# Patient Record
Sex: Female | Born: 1960 | Race: Black or African American | Hispanic: No | Marital: Married | Smoking: Never smoker
Health system: Southern US, Community
[De-identification: ages and names within clinical notes are randomized; demographics above are authoritative.]

## PROBLEM LIST (undated history)

## (undated) DIAGNOSIS — C50919 Malignant neoplasm of unspecified site of unspecified female breast: Secondary | ICD-10-CM

## (undated) DIAGNOSIS — N6009 Solitary cyst of unspecified breast: Secondary | ICD-10-CM

## (undated) DIAGNOSIS — R51 Headache: Secondary | ICD-10-CM

## (undated) DIAGNOSIS — Z923 Personal history of irradiation: Secondary | ICD-10-CM

## (undated) DIAGNOSIS — T7840XA Allergy, unspecified, initial encounter: Secondary | ICD-10-CM

## (undated) HISTORY — DX: Headache: R51

## (undated) HISTORY — DX: Malignant neoplasm of unspecified site of unspecified female breast: C50.919

## (undated) HISTORY — DX: Allergy, unspecified, initial encounter: T78.40XA

## (undated) HISTORY — PX: KNEE SURGERY: SHX244

## (undated) HISTORY — DX: Personal history of irradiation: Z92.3

## (undated) HISTORY — PX: CARPAL TUNNEL RELEASE: SHX101

## (undated) HISTORY — DX: Solitary cyst of unspecified breast: N60.09

---

## 1991-08-26 HISTORY — PX: FOOT SURGERY: SHX648

## 2000-03-10 ENCOUNTER — Ambulatory Visit (HOSPITAL_COMMUNITY): Admission: RE | Admit: 2000-03-10 | Discharge: 2000-03-10 | Payer: Self-pay | Admitting: Family Medicine

## 2000-03-10 ENCOUNTER — Encounter: Payer: Self-pay | Admitting: Family Medicine

## 2002-01-10 ENCOUNTER — Encounter: Payer: Self-pay | Admitting: Family Medicine

## 2002-01-10 ENCOUNTER — Encounter: Admission: RE | Admit: 2002-01-10 | Discharge: 2002-01-10 | Payer: Self-pay | Admitting: Family Medicine

## 2002-02-01 ENCOUNTER — Encounter: Payer: Self-pay | Admitting: Family Medicine

## 2002-02-01 ENCOUNTER — Encounter: Admission: RE | Admit: 2002-02-01 | Discharge: 2002-02-01 | Payer: Self-pay | Admitting: Family Medicine

## 2003-11-03 ENCOUNTER — Encounter: Admission: RE | Admit: 2003-11-03 | Discharge: 2003-11-03 | Payer: Self-pay | Admitting: Family Medicine

## 2006-08-03 ENCOUNTER — Other Ambulatory Visit: Admission: RE | Admit: 2006-08-03 | Discharge: 2006-08-03 | Payer: Self-pay | Admitting: Family Medicine

## 2006-08-13 ENCOUNTER — Encounter: Admission: RE | Admit: 2006-08-13 | Discharge: 2006-08-13 | Payer: Self-pay | Admitting: Family Medicine

## 2008-11-21 ENCOUNTER — Emergency Department (HOSPITAL_COMMUNITY): Admission: EM | Admit: 2008-11-21 | Discharge: 2008-11-21 | Payer: Self-pay | Admitting: Emergency Medicine

## 2009-12-28 ENCOUNTER — Encounter: Admission: RE | Admit: 2009-12-28 | Discharge: 2009-12-28 | Payer: Self-pay | Admitting: Obstetrics and Gynecology

## 2010-12-05 LAB — POCT URINALYSIS DIP (DEVICE)
Glucose, UA: NEGATIVE mg/dL
Nitrite: NEGATIVE
Protein, ur: NEGATIVE mg/dL
Specific Gravity, Urine: 1.02 (ref 1.005–1.030)

## 2010-12-05 LAB — POCT PREGNANCY, URINE: Preg Test, Ur: NEGATIVE

## 2010-12-05 LAB — GC/CHLAMYDIA PROBE AMP, GENITAL: GC Probe Amp, Genital: NEGATIVE

## 2011-08-25 ENCOUNTER — Other Ambulatory Visit: Payer: Self-pay | Admitting: Obstetrics and Gynecology

## 2011-08-25 DIAGNOSIS — R928 Other abnormal and inconclusive findings on diagnostic imaging of breast: Secondary | ICD-10-CM

## 2011-09-09 ENCOUNTER — Ambulatory Visit
Admission: RE | Admit: 2011-09-09 | Discharge: 2011-09-09 | Disposition: A | Discharge status: Home / Self Care | Payer: BC Managed Care – PPO | Source: Ambulatory Visit | Attending: Obstetrics and Gynecology | Admitting: Obstetrics and Gynecology

## 2011-09-09 DIAGNOSIS — R928 Other abnormal and inconclusive findings on diagnostic imaging of breast: Secondary | ICD-10-CM

## 2012-03-25 ENCOUNTER — Other Ambulatory Visit: Payer: Self-pay | Admitting: Obstetrics and Gynecology

## 2012-03-25 DIAGNOSIS — R921 Mammographic calcification found on diagnostic imaging of breast: Secondary | ICD-10-CM

## 2012-08-25 DIAGNOSIS — Z923 Personal history of irradiation: Secondary | ICD-10-CM

## 2012-08-25 HISTORY — PX: BREAST LUMPECTOMY: SHX2

## 2012-08-25 HISTORY — DX: Personal history of irradiation: Z92.3

## 2012-08-25 HISTORY — PX: BREAST BIOPSY: SHX20

## 2012-12-14 ENCOUNTER — Other Ambulatory Visit: Payer: Self-pay | Admitting: Obstetrics and Gynecology

## 2012-12-14 ENCOUNTER — Ambulatory Visit
Admission: RE | Admit: 2012-12-14 | Discharge: 2012-12-14 | Disposition: A | Discharge status: Home / Self Care | Payer: BC Managed Care – PPO | Source: Ambulatory Visit | Attending: Obstetrics and Gynecology | Admitting: Obstetrics and Gynecology

## 2012-12-14 DIAGNOSIS — R921 Mammographic calcification found on diagnostic imaging of breast: Secondary | ICD-10-CM

## 2012-12-14 DIAGNOSIS — C50919 Malignant neoplasm of unspecified site of unspecified female breast: Secondary | ICD-10-CM

## 2012-12-14 HISTORY — DX: Malignant neoplasm of unspecified site of unspecified female breast: C50.919

## 2012-12-15 ENCOUNTER — Other Ambulatory Visit: Payer: Self-pay | Admitting: Obstetrics and Gynecology

## 2012-12-15 DIAGNOSIS — C50911 Malignant neoplasm of unspecified site of right female breast: Secondary | ICD-10-CM

## 2012-12-17 ENCOUNTER — Telehealth: Payer: Self-pay | Admitting: *Deleted

## 2012-12-17 DIAGNOSIS — C50111 Malignant neoplasm of central portion of right female breast: Secondary | ICD-10-CM

## 2012-12-17 DIAGNOSIS — C50119 Malignant neoplasm of central portion of unspecified female breast: Secondary | ICD-10-CM | POA: Insufficient documentation

## 2012-12-17 NOTE — Telephone Encounter (Signed)
Confirmed [facility] for 12/22/12 at 1200.  Instructions and contact information given.

## 2012-12-18 ENCOUNTER — Ambulatory Visit
Admission: RE | Admit: 2012-12-18 | Discharge: 2012-12-18 | Disposition: A | Discharge status: Home / Self Care | Payer: BC Managed Care – PPO | Source: Ambulatory Visit | Attending: Obstetrics and Gynecology | Admitting: Obstetrics and Gynecology

## 2012-12-18 DIAGNOSIS — N6009 Solitary cyst of unspecified breast: Secondary | ICD-10-CM

## 2012-12-18 DIAGNOSIS — C50911 Malignant neoplasm of unspecified site of right female breast: Secondary | ICD-10-CM

## 2012-12-18 HISTORY — DX: Solitary cyst of unspecified breast: N60.09

## 2012-12-18 MED ORDER — GADOBENATE DIMEGLUMINE 529 MG/ML IV SOLN
15.0000 mL | Freq: Once | INTRAVENOUS | Status: AC | PRN
  Administered 2012-12-18: 15 mL via INTRAVENOUS

## 2012-12-22 ENCOUNTER — Ambulatory Visit (HOSPITAL_BASED_OUTPATIENT_CLINIC_OR_DEPARTMENT_OTHER): Payer: BC Managed Care – PPO | Admitting: Oncology

## 2012-12-22 ENCOUNTER — Ambulatory Visit: Payer: BC Managed Care – PPO | Admitting: Physical Therapy

## 2012-12-22 ENCOUNTER — Encounter (INDEPENDENT_AMBULATORY_CARE_PROVIDER_SITE_OTHER): Payer: Self-pay | Admitting: General Surgery

## 2012-12-22 ENCOUNTER — Ambulatory Visit
Admission: RE | Admit: 2012-12-22 | Discharge: 2012-12-22 | Disposition: A | Discharge status: Home / Self Care | Payer: BC Managed Care – PPO | Source: Ambulatory Visit | Attending: Radiation Oncology | Admitting: Radiation Oncology

## 2012-12-22 ENCOUNTER — Ambulatory Visit (HOSPITAL_BASED_OUTPATIENT_CLINIC_OR_DEPARTMENT_OTHER): Payer: BC Managed Care – PPO | Admitting: General Surgery

## 2012-12-22 ENCOUNTER — Other Ambulatory Visit (HOSPITAL_BASED_OUTPATIENT_CLINIC_OR_DEPARTMENT_OTHER): Payer: BC Managed Care – PPO | Admitting: Lab

## 2012-12-22 ENCOUNTER — Encounter: Payer: Self-pay | Admitting: Specialist

## 2012-12-22 ENCOUNTER — Encounter: Payer: Self-pay | Admitting: *Deleted

## 2012-12-22 ENCOUNTER — Encounter: Payer: Self-pay | Admitting: Radiation Oncology

## 2012-12-22 ENCOUNTER — Ambulatory Visit: Payer: BC Managed Care – PPO

## 2012-12-22 VITALS — BP 119/67 | HR 82 | Temp 98.3°F | Resp 20 | Ht 62.0 in | Wt 165.5 lb

## 2012-12-22 DIAGNOSIS — C50111 Malignant neoplasm of central portion of right female breast: Secondary | ICD-10-CM

## 2012-12-22 DIAGNOSIS — C50119 Malignant neoplasm of central portion of unspecified female breast: Secondary | ICD-10-CM

## 2012-12-22 LAB — COMPREHENSIVE METABOLIC PANEL (CC13)
AST: 16 U/L (ref 5–34)
Calcium: 9.2 mg/dL (ref 8.4–10.4)
Glucose: 95 mg/dl (ref 70–99)
Potassium: 4.1 mEq/L (ref 3.5–5.1)
Sodium: 141 mEq/L (ref 136–145)
Total Bilirubin: 0.28 mg/dL (ref 0.20–1.20)

## 2012-12-22 LAB — CBC WITH DIFFERENTIAL/PLATELET
Basophils Absolute: 0 10*3/uL (ref 0.0–0.1)
EOS%: 1.3 % (ref 0.0–7.0)
Eosinophils Absolute: 0.1 10*3/uL (ref 0.0–0.5)
HCT: 37.8 % (ref 34.8–46.6)
HGB: 12.3 g/dL (ref 11.6–15.9)
LYMPH%: 44.2 % (ref 14.0–49.7)
MCH: 29.1 pg (ref 25.1–34.0)
MCV: 89.1 fL (ref 79.5–101.0)
MONO%: 7.3 % (ref 0.0–14.0)
NEUT#: 2.5 10*3/uL (ref 1.5–6.5)
RDW: 13.1 % (ref 11.2–14.5)
WBC: 5.4 10*3/uL (ref 3.9–10.3)

## 2012-12-22 NOTE — Progress Notes (Signed)
I met the patient and her husband and daughter today at breast clinic.  She rated her distress as "2".  I told Brittany Colon about the available support services and made a referral to [facility] on her behalf.

## 2012-12-22 NOTE — Progress Notes (Signed)
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 female who is seen out of the courtesy of Dr. Johna Sheriff as part of the multi disciplinary breast clinic.   Earlier this year on routine screening mammography the patient was noted to have  suspicious calcifications in the upper central aspect of the right breast.  this area was biopsied and revealed high-grade ductal carcinoma in situ grade.   the tumor was estrogen receptor positive at 100% and progesterone receptor positive at 100%. An MRI of the breast area was performed which revealed in the upper central aspect of the right breast a small signal void consistent with tissue marker placed at the time of stereotactic guided core biopsy. There was no other suspicious changes noted in the left or right breast or axillary region. Patient was noted to have multiple bilateral breast cysts on her MRI.   PREVIOUS RADIATION THERAPY: No  PAST MEDICAL HISTORY:  has a past medical history of Breast cancer and Headache.    PAST SURGICAL HISTORY: Past Surgical History  Procedure Laterality Date  . Carpal tunnel release Right     FAMILY HISTORY: family history is not on file.  SOCIAL HISTORY:  reports that she has never smoked. She does not have any smokeless tobacco history on file. She reports that she does not drink alcohol or use illicit drugs.  she works as a Architectural technologist  ALLERGIES: Sulfur  MEDICATIONS:  Current Outpatient Prescriptions  Medication Sig Dispense Refill  . glucosamine-chondroitin 500-400 MG tablet Take 1 tablet by mouth daily.      . naproxen sodium (ANAPROX) 220 MG  tablet Take 220 mg by mouth as needed.       No current facility-administered medications for this encounter.    REVIEW OF SYSTEMS:  A 15 point review of systems is documented in the electronic medical record. This was obtained by the nursing staff. However, I reviewed this with the patient to discuss relevant findings and make appropriate changes.  Prior to biopsy the patient denied any pain in the breast area nipple discharge or bleeding. She denies any new bony pain headaches dizziness or blurred vision.   PHYSICAL EXAM: In Gen. this is a very pleasant healthy-appearing 52 year old female in no acute distress. She is accompanied by her husband and daughter on evaluation today. Examination of the neck and supraclavicular region reveals no evidence of adenopathy. The axillary areas are free of adenopathy. Examination of the lungs reveals them to be clear. The heart has a regular rhythm and rate. Examination of the left breast reveals multiple cysts noted in the lateral aspect of the breast. There is no dominant mass appreciated in the left breast nipple discharge or bleeding. Examination of the right breast reveals some bruising in the upper central aspect. There multiple cysts palpated in the right breast. There is no dominant mass appreciated in the right breast nipple discharge or bleeding.   LABORATORY DATA:  Lab Results  Component Value Date   WBC 5.4 12/22/2012   HGB 12.3 12/22/2012   HCT 37.8 12/22/2012   MCV 89.1 12/22/2012   PLT 241 12/22/2012   Lab Results  Component Value Date  NA 141 12/22/2012   K 4.1 12/22/2012   CL 107 12/22/2012   CO2 28 12/22/2012   Lab Results  Component Value Date   ALT 11 12/22/2012   AST 16 12/22/2012   ALKPHOS 61 12/22/2012   BILITOT 0.28 12/22/2012     RADIOGRAPHY: Mr Breast Bilateral W Wo Contrast  12/20/2012  *RADIOLOGY REPORT*  Clinical Data: Recent stereotactic guided core biopsy shows ductal carcinoma in situ in the upper central portion of the  right breast.  BILATERAL BREAST MRI WITH AND WITHOUT CONTRAST  Technique: Multiplanar, multisequence MR images of both breasts were obtained prior to and following the intravenous administration of 15ml of Multihance.  Three dimensional images were evaluated at the independent DynaCad workstation.  Comparison:  [facility] 12/14/2012 and earlier  Findings: Within the upper central portion of the right breast, there is a small signal void from tissue marker placed at the time of stereotactic guided core biopsy.  Surrounding this, there is minimal hematoma/seroma.  Throughout both breasts, there are numerous cysts.  Some are high signal intensity on T2-weighted images.  Others are lower in signal.  No suspicious, enhancing masses are identified in either breast.  Several rim enhancing cyst are noted bilaterally, a finding that can be associated with inflammation or previous aspiration.  Specifically, in the area of recent biopsy, there is no significant enhancement.  Overall, background parenchymal enhancement is minimal  No suspicious adenopathy identified in the internal mammary or axillary regions.  IMPRESSION:  1.  Expected post-biopsy changes in the upper central portion of the right breast, without significant enhancement. 2.  Numerous bilateral breast cysts. 3.  No additional enhancement in either breast to suggest malignancy.  RECOMMENDATION: Treatment plan  THREE-DIMENSIONAL MR IMAGE RENDERING ON INDEPENDENT WORKSTATION:  Three-dimensional MR images were rendered by post-processing of the original MR data on an independent workstation.  The three- dimensional MR images were interpreted, and findings were reported in the accompanying complete MRI report for this study.  BI-RADS CATEGORY 6:  Known biopsy-proven malignancy - appropriate action should be taken.   Original Report Authenticated By: Norva Pavlov, M.D.    Mm Digital Diagnostic Bilat  12/14/2012   *RADIOLOGY REPORT*  Clinical Data:  The patient returns for short interval follow-up of calcifications in the upper portion of the right breast.  She did not return for recommended 39-month follow-up.  DIGITAL DIAGNOSTIC BILATERAL MAMMOGRAM WITH CAD  Comparison: 09/09/2011, 08/14/2011, 12/28/2009, 08/13/2006  Findings:  ACR Breast Density Category 3: The breast tissue is heterogeneously dense.  Calcifications at 12 o'clock 7 cm from the right nipple have increased since prior study.  These cover an area of approximately 6 x 7 x 7 mm.  They vary in size and shape and are tightly clustered.  The appearance is suspicious.  Multiple bilateral fluctuating partially obscured partially circumscribed masses consistent with cysts are again noted.  No other abnormality is identified.  Mammographic images were processed with CAD.  IMPRESSION: Suspicious calcifications in the 12 o'clock position of the right breast, 7 cm from the nipple.  RECOMMENDATION: Biopsy is recommended.  Stereotactic core needle biopsy was discussed with the patient.  This will be performed and reported separately.  I have discussed the findings and recommendations with the patient. Results were also provided in writing at the conclusion of the visit.  If applicable, a reminder letter will be sent to the patient regarding her next appointment.  BI-RADS CATEGORY 4:  Suspicious abnormality -  biopsy should be considered.   Original Report Authenticated By: Cain Saupe, M.D.    Mm Rt Breast Bx W Loc Dev 1st Lesion Image Bx Spec Stereo Guide  12/15/2012  **ADDENDUM** CREATED: 12/15/2012 13:08:01  The pathology revealed ductal carcinoma in situ.  This is found to be concordant with imaging findings.  I discussed the results over the phone with the patient and answer her questions.  The patient has appointment for MRI of the breast on December 18, 2012.  The patient also has appointment at multidisciplinary cancer clinic on December 22, 2012.  Recommend surgical  excision and MRI of the breasts.  **END ADDENDUM** SIGNED BY: Sherian Rein, M.D.   12/14/2012  *RADIOLOGY REPORT*  Clinical Data:  Microcalcifications at 12 o'clock, 7 cm from the right nipple  STEREOTACTIC-GUIDED VACUUM ASSISTED BIOPSY OF THE RIGHT BREAST AND SPECIMEN RADIOGRAPH  The patient and I discussed the procedure of stereotactic-guided biopsy, including benefits and alternatives.  We discussed the high likelihood of a successful procedure. We discussed the risks of the procedure, including infection, bleeding, tissue injury, clip migration, and inadequate sampling.  Written informed consent was given. Appropriate time-out was performed.  Using sterile technique, 2% lidocaine, stereotactic guidance, and a 9 gauge vacuum assisted device, biopsy was performed of the calcifications at 12 o'clock 7 cm from the right nipple using a craniocaudal approach.  Specimen radiograph was performed, showing microcalcifications within multiple core specimens.  Specimens with calcifications are identified for pathology.  At the conclusion of the procedure, a dumbbell shaped tissue marker clip was deployed into the biopsy cavity.  Follow-up 2-view mammogram confirmed clip placement and removal of many of the calcifications.  IMPRESSION: Stereotactic-guided biopsy of calcifications at 12 o'clock, 7 cm from the right nipple.  No apparent complications.   Original Report Authenticated By: Cain Saupe, M.D.       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  The patient would be an excellent candidate for breast conservation therapy with excisional biopsy followed by radiation therapy.  The patient does not wish to consider mastectomy for management in this situation.  PLAN: The patient will be scheduled for surgery with Dr. Johna Sheriff in the near future. Patient will be seen in the postoperative setting for evaluation and planning of her radiation therapy as part of breast conserving treatment.      ------------------------------------------------  -----------------------------------  Billie Lade, PhD, MD

## 2012-12-22 NOTE — Progress Notes (Signed)
Subjective:   new diagnosis DCIS right breast  Patient ID: Brittany Colon, female   DOB: [birth year], 52 y.o.   MRN: [id number]  HPI Patient is a very pleasant 52 year old female seen in the breast multidisciplinary clinic for a new diagnosis of ductal carcinoma in situ of the right breast. The patient recently presented for followup mammogram and was found to have a tightly clustered area of abnormal microcalcifications in the upper central right breast measuring 7 mm in diameter. Large core needle biopsy was recommended and performed. I've reviewed pathology which has shown high-grade ductal carcinoma in situ with necrosis. She subsequently has had bilateral breast MRI which has revealed a small infiltrate in the area of the biopsy but no abnormal enhancement. There are multiple cysts throughout both breasts. The patient had not noted any change in her breast exam prior to her diagnosis. She has chronically "lumpy" breasts. She has had no unusual pain or nipple discharge or bleeding or skin changes.  Past Medical History  Diagnosis Date  . Breast cancer   . Headache    Past Surgical History  Procedure Laterality Date  . Carpal tunnel release Right    Current Outpatient Prescriptions  Medication Sig Dispense Refill  . glucosamine-chondroitin 500-400 MG tablet Take 1 tablet by mouth daily.      . naproxen sodium (ANAPROX) 220 MG tablet Take 220 mg by mouth as needed.       No current facility-administered medications for this visit.   Allergies  Allergen Reactions  . Sulfur     vomiting   History  Substance Use Topics  . Smoking status: Never Smoker   . Smokeless tobacco: Not on file  . Alcohol Use: No  She works as a Chartered loss adjuster.   Review of Systems  Constitutional: Negative.   HENT: Negative.   Respiratory: Negative.   Cardiovascular: Negative.   Gastrointestinal: Negative.        Has never had colonoscopy.  Musculoskeletal: Negative.   Neurological: Negative.       Objective:   Physical Exam General: Well-developed healthy-appearing African American female in no distress Skin: No rash or infection HEENT: No palpable masses or thyromegaly. Pupils equal and reactive. Sclerae clear. Lymph nodes: No cervical, supraclavicular or axillary nodes palpable Breasts: Slight swelling in the upper central right breast at the core biopsy site. There is some irregular breast tissue bilaterally. No skin or nipple changes. Again no axillary nodes palpable Lungs: Clear equal breath sounds bilaterally without wheezing agree for breathing Cardiac: Regular rate and rhythm without murmur. No edema. Abdomen: Soft and nontender. No masses or organomegaly. Extremities: No joint swelling or edema or deformity Neurologic: Alert and fully oriented. Affect normal. Gait normal.    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 We discussed in detail initial surgical treatment options. She certainly would be a good candidate for breast conservation which is what she would prefer. We discussed mastectomy as an alternative which would not require radiation but she does prefer minimal surgery. We discussed needle localized lumpectomy as an outpatient. We discussed the rationale for the procedure and its nature in recovery as well as risks of anesthetic complications, bleeding, infection and possible need for further surgery based on final pathology.    Plan:     Needle localized right breast lumpectomy under general anesthesia an outpatient

## 2012-12-23 ENCOUNTER — Telehealth: Payer: Self-pay | Admitting: *Deleted

## 2012-12-23 NOTE — Telephone Encounter (Signed)
Lm stating that [facility] would like to see her on 03/14/13 @ 2:30pm. i also made pt aware that i will mail a letter/cal as well...td

## 2012-12-24 NOTE — Progress Notes (Signed)
ID: Brittany Colon   DOB: May 20, 1961  MR#: [id number]  NWG#:[id number]  PCP: Thayer Headings, MD GYN: Carrington Clamp SU: Glenna Fellows OTHER MD: Antony Blackbird   HISTORY OF PRESENT ILLNESS: "Brittany Colon" has lumpy breasts, and has had diagnostic mammography previously. Screening mammography 08/14/2011 suggested a possible change in the right breast, and diagnostic right breast mammography 09/09/2011 found a cluster of coarse calcifications superiorly in the right breast. These were felt to be likely benign, and six-month followup was recommended. However the next mammogram was 12/14/2012. This showed the calcifications in question to have increased since the prior study. The covered an area of 7 mm. 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  Bilateral breast MRI 12/18/2012 showed only post biopsy change and multiple breast cysts bilaterally. The patient's subsequent history is as detailed below  INTERVAL HISTORY: Brittany Colon was seen at the multidisciplinary breast cancer clinic 12/22/2012 accompanied by her husband Brittany Colon and her daughter Brittany Colon  REVIEW OF SYSTEMS: The patient had no symptoms leading to this mammogram, which was actually delayed, as she had not shown for the 6 month followup. She describes herself as mildly fatigued. Sometimes she feels palpitations. These are not accompanied by chest pain or pressure or shortness of breath. She can however be short of breath if she climbs to sleep flight of steps. She has joint pains here in there which are not more frequent or intense than prior. She has rare headaches. A detailed review of systems was otherwise noncontributory   PAST MEDICAL HISTORY: Past Medical History  Diagnosis Date  . Breast cancer   . Headache     PAST SURGICAL HISTORY: Past Surgical History  Procedure Laterality Date  . Carpal tunnel release Right     FAMILY  HISTORY No family history on file. The patient's father died from Parkinson's disease at age 55. The patient's mother died from a myocardial infarction at age 68. Lives has 3 brothers, one sister. There is no history of cancer in the family to her knowledge.   GYNECOLOGIC HISTORY: Menarche age 32, first live birth age 74. She is GX P2. She still having regular periods. She never took birth control pills.   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 Her husband Brittany Colon is disabled secondary to sleep apnea and fibromyalgia. Daughter Brittany Colon works as a Engineer, site at [employer] son Brittany Colon is Clinical biochemist at [employer]. The patient has 1 grandchild. She attends a [employer] in [place]   ADVANCED DIRECTIVES:Not in place   HEALTH MAINTENANCE: History  Substance Use Topics  . Smoking status: Never Smoker   . Smokeless tobacco: Not on file  . Alcohol Use: No     Colonoscopy:  PAP:  Bone density:  Lipid panel:  Allergies  Allergen Reactions  . Sulfur     vomiting    Current Outpatient Prescriptions  Medication Sig Dispense Refill  . glucosamine-chondroitin 500-400 MG tablet Take 1 tablet by mouth daily.      . naproxen sodium (ANAPROX) 220 MG tablet Take 220 mg by mouth as needed.       No current facility-administered medications for this visit.    OBJECTIVE: Middle-aged [place] American woman who appears well  Filed Vitals:   12/22/12 1245  BP: 119/67  Pulse: 82  Temp: 98.3 F (36.8 C)  Resp: 20     Body mass index  is 30.26 kg/(m^2).    ECOG FS: 0  Sclerae unicteric Oropharynx clear No cervical or supraclavicular adenopathy Lungs no rales or rhonchi Heart regular rate and rhythm Abd benign MSK no focal spinal tenderness, no peripheral edema Neuro: nonfocal, well oriented, appropriate affect  Breasts: The right breast is status post recent biopsy. Both breasts are 1P. There is no  skin or nipple involvement on the right and the right axilla is benign   LAB RESULTS: Lab Results  Component Value Date   WBC 5.4 12/22/2012   NEUTROABS 2.5 12/22/2012   HGB 12.3 12/22/2012   HCT 37.8 12/22/2012   MCV 89.1 12/22/2012   PLT 241 12/22/2012      Chemistry      Component Value Date/Time   NA 141 12/22/2012 1219   K 4.1 12/22/2012 1219   CL 107 12/22/2012 1219   CO2 28 12/22/2012 1219   BUN 14.3 12/22/2012 1219   CREATININE 0.8 12/22/2012 1219      Component Value Date/Time   CALCIUM 9.2 12/22/2012 1219   ALKPHOS 61 12/22/2012 1219   AST 16 12/22/2012 1219   ALT 11 12/22/2012 1219   BILITOT 0.28 12/22/2012 1219       No results found for this basename: LABCA2    No components found with this basename: LABCA125    No results found for this basename: INR,  in the last 168 hours  Urinalysis    Component Value Date/Time   LABSPEC 1.020 11/21/2008 1036   PHURINE 6.5 11/21/2008 1036   GLUCOSEU NEGATIVE 11/21/2008 1036   HGBUR NEGATIVE 11/21/2008 1036   BILIRUBINUR NEGATIVE 11/21/2008 1036   KETONESUR NEGATIVE 11/21/2008 1036   PROTEINUR NEGATIVE 11/21/2008 1036   UROBILINOGEN 0.2 11/21/2008 1036   NITRITE NEGATIVE 11/21/2008 1036   LEUKOCYTESUR NEGATIVE Biochemical Testing Only. Please order routine urinalysis from main lab if confirmatory testing is needed. 11/21/2008 1036    STUDIES: Mr Breast Bilateral W Wo Contrast  12/20/2012  *RADIOLOGY REPORT*  Clinical Data: Recent stereotactic guided core biopsy shows ductal carcinoma in situ in the upper central portion of the right breast.  BILATERAL BREAST MRI WITH AND WITHOUT CONTRAST  Technique: Multiplanar, multisequence MR images of both breasts were obtained prior to and following the intravenous administration of 15ml of Multihance.  Three dimensional images were evaluated at the independent DynaCad workstation.  Comparison:  [facility] 12/14/2012 and earlier  Findings: Within the  upper central portion of the right breast, there is a small signal void from tissue marker placed at the time of stereotactic guided core biopsy.  Surrounding this, there is minimal hematoma/seroma.  Throughout both breasts, there are numerous cysts.  Some are high signal intensity on T2-weighted images.  Others are lower in signal.  No suspicious, enhancing masses are identified in either breast.  Several rim enhancing cyst are noted bilaterally, a finding that can be associated with inflammation or previous aspiration.  Specifically, in the area of recent biopsy, there is no significant enhancement.  Overall, background parenchymal enhancement is minimal  No suspicious adenopathy identified in the internal mammary or axillary regions.  IMPRESSION:  1.  Expected post-biopsy changes in the upper central portion of the right breast, without significant enhancement. 2.  Numerous bilateral breast cysts. 3.  No additional enhancement in either breast to suggest malignancy.  RECOMMENDATION: Treatment plan  THREE-DIMENSIONAL MR IMAGE RENDERING ON INDEPENDENT WORKSTATION:  Three-dimensional MR images were rendered by post-processing of the original MR  data on an independent workstation.  The three- dimensional MR images were interpreted, and findings were reported in the accompanying complete MRI report for this study.  BI-RADS CATEGORY 6:  Known biopsy-proven malignancy - appropriate action should be taken.   Original Report Authenticated By: Norva Pavlov, M.D.    Mm Digital Diagnostic Bilat  12/14/2012  *RADIOLOGY REPORT*  Clinical Data:  The patient returns for short interval follow-up of calcifications in the upper portion of the right breast.  She did not return for recommended 25-month follow-up.  DIGITAL DIAGNOSTIC BILATERAL MAMMOGRAM WITH CAD  Comparison: 09/09/2011, 08/14/2011, 12/28/2009, 08/13/2006  Findings:  ACR Breast Density Category 3: The breast tissue is heterogeneously dense.  Calcifications at 12  o'clock 7 cm from the right nipple have increased since prior study.  These cover an area of approximately 6 x 7 x 7 mm.  They vary in size and shape and are tightly clustered.  The appearance is suspicious.  Multiple bilateral fluctuating partially obscured partially circumscribed masses consistent with cysts are again noted.  No other abnormality is identified.  Mammographic images were processed with CAD.  IMPRESSION: Suspicious calcifications in the 12 o'clock position of the right breast, 7 cm from the nipple.  RECOMMENDATION: Biopsy is recommended.  Stereotactic core needle biopsy was discussed with the patient.  This will be performed and reported separately.  I have discussed the findings and recommendations with the patient. Results were also provided in writing at the conclusion of the visit.  If applicable, a reminder letter will be sent to the patient regarding her next appointment.  BI-RADS CATEGORY 4:  Suspicious abnormality - biopsy should be considered.   Original Report Authenticated By: Cain Saupe, M.D.    Mm Rt Breast Bx W Loc Dev 1st Lesion Image Bx Spec Stereo Guide  12/15/2012  **ADDENDUM** CREATED: 12/15/2012 13:08:01  The pathology revealed ductal carcinoma in situ.  This is found to be concordant with imaging findings.  I discussed the results over the phone with the patient and answer her questions.  The patient has appointment for MRI of the breast on December 18, 2012.  The patient also has appointment at multidisciplinary cancer clinic on December 22, 2012.  Recommend surgical excision and MRI of the breasts.  **END ADDENDUM** SIGNED BY: Sherian Rein, M.D.   12/14/2012  *RADIOLOGY REPORT*  Clinical Data:  Microcalcifications at 12 o'clock, 7 cm from the right nipple  STEREOTACTIC-GUIDED VACUUM ASSISTED BIOPSY OF THE RIGHT BREAST AND SPECIMEN RADIOGRAPH  The patient and I discussed the procedure of stereotactic-guided biopsy, including benefits and alternatives.  We discussed the high  likelihood of a successful procedure. We discussed the risks of the procedure, including infection, bleeding, tissue injury, clip migration, and inadequate sampling.  Written informed consent was given. Appropriate time-out was performed.  Using sterile technique, 2% lidocaine, stereotactic guidance, and a 9 gauge vacuum assisted device, biopsy was performed of the calcifications at 12 o'clock 7 cm from the right nipple using a craniocaudal approach.  Specimen radiograph was performed, showing microcalcifications within multiple core specimens.  Specimens with calcifications are identified for pathology.  At the conclusion of the procedure, a dumbbell shaped tissue marker clip was deployed into the biopsy cavity.  Follow-up 2-view mammogram confirmed clip placement and removal of many of the calcifications.  IMPRESSION: Stereotactic-guided biopsy of calcifications at 12 o'clock, 7 cm from the right nipple.  No apparent complications.   Original Report Authenticated By: Cain Saupe, M.D.     ASSESSMENT:  52 y.o. [place] woman status post right breast biopsy 12/14/2012 for a high-grade ductal carcinoma in situ, 100% estrogen receptor positive, 100% progesterone receptor positive.  PLAN: We spent the better part of today's hour-long visit discussing the biology of breast cancer in general, and Liz's situation in particular. She understands her cancer is not life-threatening, as it is noninvasive. It does need to be dealt with, and she is a very good candidate for breast conservation. We're not planning to do a sentinel lymph node. She can consider our B-43 study and this can be operationalized once she sees radiation oncology after her surgery.  I am going to see her again in mid July. At that time she will have completed her local treatment and will be ready to consider systemic adjuvant antiestrogen therapy.  Yannis Broce C    12/24/2012

## 2012-12-28 ENCOUNTER — Telehealth: Payer: Self-pay | Admitting: *Deleted

## 2012-12-28 NOTE — Telephone Encounter (Signed)
This RN received call from pt inquiring about follow up post surgery- " my surgery is scheduled for 5/19 and my appointment with Dr Darnelle Catalan isn't until July

## 2013-01-04 ENCOUNTER — Encounter (HOSPITAL_BASED_OUTPATIENT_CLINIC_OR_DEPARTMENT_OTHER): Payer: Self-pay | Admitting: *Deleted

## 2013-01-04 NOTE — Progress Notes (Signed)
Pt had some palpitations about 15-20 yr ago-worh halter monitor-nothing was found-no meds and no problems since- No labs needed

## 2013-01-10 ENCOUNTER — Ambulatory Visit (HOSPITAL_BASED_OUTPATIENT_CLINIC_OR_DEPARTMENT_OTHER): Payer: BC Managed Care – PPO | Admitting: Anesthesiology

## 2013-01-10 ENCOUNTER — Ambulatory Visit
Admission: RE | Admit: 2013-01-10 | Discharge: 2013-01-10 | Disposition: A | Discharge status: Home / Self Care | Payer: BC Managed Care – PPO | Source: Ambulatory Visit | Attending: General Surgery | Admitting: General Surgery

## 2013-01-10 ENCOUNTER — Encounter (HOSPITAL_BASED_OUTPATIENT_CLINIC_OR_DEPARTMENT_OTHER)
Admission: RE | Disposition: A | Discharge status: Home / Self Care | Payer: Self-pay | Source: Ambulatory Visit | Attending: General Surgery

## 2013-01-10 ENCOUNTER — Encounter (HOSPITAL_BASED_OUTPATIENT_CLINIC_OR_DEPARTMENT_OTHER): Payer: Self-pay | Admitting: Anesthesiology

## 2013-01-10 ENCOUNTER — Ambulatory Visit (HOSPITAL_BASED_OUTPATIENT_CLINIC_OR_DEPARTMENT_OTHER)
Admission: RE | Admit: 2013-01-10 | Discharge: 2013-01-10 | Disposition: A | Discharge status: Home / Self Care | Payer: BC Managed Care – PPO | Source: Ambulatory Visit | Attending: General Surgery | Admitting: General Surgery

## 2013-01-10 DIAGNOSIS — C50111 Malignant neoplasm of central portion of right female breast: Secondary | ICD-10-CM

## 2013-01-10 DIAGNOSIS — D0590 Unspecified type of carcinoma in situ of unspecified breast: Secondary | ICD-10-CM | POA: Insufficient documentation

## 2013-01-10 HISTORY — PX: BREAST LUMPECTOMY WITH NEEDLE LOCALIZATION: SHX5759

## 2013-01-10 SURGERY — BREAST LUMPECTOMY WITH NEEDLE LOCALIZATION
Anesthesia: General | Site: Breast | Laterality: Right | Wound class: Clean

## 2013-01-10 MED ORDER — FENTANYL CITRATE 0.05 MG/ML IJ SOLN: 25.0000 ug | INTRAMUSCULAR | Status: DC | PRN

## 2013-01-10 MED ORDER — OXYCODONE HCL 5 MG PO TABS
5.0000 mg | ORAL_TABLET | Freq: Once | ORAL | Status: AC | PRN
  Administered 2013-01-10: 5 mg via ORAL

## 2013-01-10 MED ORDER — OXYCODONE HCL 5 MG/5ML PO SOLN: 5.0000 mg | Freq: Once | ORAL | Status: AC | PRN

## 2013-01-10 MED ORDER — LACTATED RINGERS IV SOLN
INTRAVENOUS | Status: DC
  Administered 2013-01-10: 13:00:00 via INTRAVENOUS

## 2013-01-10 MED ORDER — BUPIVACAINE-EPINEPHRINE 0.25% -1:200000 IJ SOLN
INTRAMUSCULAR | Status: DC | PRN
  Administered 2013-01-10: 10 mL

## 2013-01-10 MED ORDER — HYDROCODONE-ACETAMINOPHEN 5-325 MG PO TABS: 1.0000 | ORAL_TABLET | ORAL | Status: DC | PRN

## 2013-01-10 MED ORDER — PROPOFOL 10 MG/ML IV BOLUS
INTRAVENOUS | Status: DC | PRN
  Administered 2013-01-10: 180 mg via INTRAVENOUS

## 2013-01-10 MED ORDER — FENTANYL CITRATE 0.05 MG/ML IJ SOLN
INTRAMUSCULAR | Status: DC | PRN
  Administered 2013-01-10: 100 ug via INTRAVENOUS

## 2013-01-10 MED ORDER — DEXAMETHASONE SODIUM PHOSPHATE 4 MG/ML IJ SOLN
INTRAMUSCULAR | Status: DC | PRN
  Administered 2013-01-10: 10 mg via INTRAVENOUS

## 2013-01-10 MED ORDER — CEFAZOLIN SODIUM-DEXTROSE 2-3 GM-% IV SOLR
2.0000 g | INTRAVENOUS | Status: AC
  Administered 2013-01-10: 2 g via INTRAVENOUS

## 2013-01-10 MED ORDER — MIDAZOLAM HCL 5 MG/5ML IJ SOLN
INTRAMUSCULAR | Status: DC | PRN
  Administered 2013-01-10: 2 mg via INTRAVENOUS

## 2013-01-10 MED ORDER — CHLORHEXIDINE GLUCONATE 4 % EX LIQD: 1.0000 "application " | Freq: Once | CUTANEOUS | Status: DC

## 2013-01-10 MED ORDER — ONDANSETRON HCL 4 MG/2ML IJ SOLN: 4.0000 mg | Freq: Four times a day (QID) | INTRAMUSCULAR | Status: DC | PRN

## 2013-01-10 MED ORDER — LIDOCAINE HCL (CARDIAC) 20 MG/ML IV SOLN
INTRAVENOUS | Status: DC | PRN
  Administered 2013-01-10: 60 mg via INTRAVENOUS

## 2013-01-10 SURGICAL SUPPLY — 46 items
BINDER BREAST LRG (GAUZE/BANDAGES/DRESSINGS) ×2 IMPLANT
BLADE SURG 10 STRL SS (BLADE) IMPLANT
BLADE SURG 15 STRL LF DISP TIS (BLADE) ×1 IMPLANT
BLADE SURG 15 STRL SS (BLADE) ×1
CANISTER SUCTION 1200CC (MISCELLANEOUS) IMPLANT
CHLORAPREP W/TINT 26ML (MISCELLANEOUS) ×2 IMPLANT
CLIP TI MEDIUM 6 (CLIP) IMPLANT
CLIP TI WIDE RED SMALL 6 (CLIP) ×2 IMPLANT
CLOTH BEACON ORANGE TIMEOUT ST (SAFETY) ×2 IMPLANT
COVER MAYO STAND STRL (DRAPES) ×2 IMPLANT
COVER TABLE BACK 60X90 (DRAPES) ×2 IMPLANT
DERMABOND ADVANCED (GAUZE/BANDAGES/DRESSINGS) ×1
DERMABOND ADVANCED .7 DNX12 (GAUZE/BANDAGES/DRESSINGS) ×1 IMPLANT
DEVICE DUBIN W/COMP PLATE 8390 (MISCELLANEOUS) ×2 IMPLANT
DRAPE PED LAPAROTOMY (DRAPES) ×2 IMPLANT
DRAPE UTILITY XL STRL (DRAPES) ×2 IMPLANT
ELECT COATED BLADE 2.86 ST (ELECTRODE) ×2 IMPLANT
ELECT REM PT RETURN 9FT ADLT (ELECTROSURGICAL) ×2
ELECTRODE REM PT RTRN 9FT ADLT (ELECTROSURGICAL) ×1 IMPLANT
GLOVE BIOGEL PI IND STRL 8 (GLOVE) ×1 IMPLANT
GLOVE BIOGEL PI INDICATOR 8 (GLOVE) ×1
GLOVE INDICATOR 7.0 STRL GRN (GLOVE) ×2 IMPLANT
GLOVE SS BIOGEL STRL SZ 7.5 (GLOVE) ×1 IMPLANT
GLOVE SUPERSENSE BIOGEL SZ 7.5 (GLOVE) ×1
GOWN PREVENTION PLUS XLARGE (GOWN DISPOSABLE) ×2 IMPLANT
GOWN PREVENTION PLUS XXLARGE (GOWN DISPOSABLE) ×2 IMPLANT
KIT MARKER MARGIN INK (KITS) ×2 IMPLANT
NEEDLE HYPO 25X1 1.5 SAFETY (NEEDLE) ×2 IMPLANT
NS IRRIG 1000ML POUR BTL (IV SOLUTION) ×2 IMPLANT
PACK BASIN DAY SURGERY FS (CUSTOM PROCEDURE TRAY) ×2 IMPLANT
PENCIL BUTTON HOLSTER BLD 10FT (ELECTRODE) ×2 IMPLANT
SLEEVE SCD COMPRESS KNEE MED (MISCELLANEOUS) ×2 IMPLANT
STAPLER VISISTAT 35W (STAPLE) IMPLANT
SUT MON AB 3-0 SH 27 (SUTURE)
SUT MON AB 3-0 SH27 (SUTURE) IMPLANT
SUT MON AB 5-0 PS2 18 (SUTURE) ×2 IMPLANT
SUT SILK 3 0 SH 30 (SUTURE) IMPLANT
SUT VIC AB 3-0 SH 27 (SUTURE) ×1
SUT VIC AB 3-0 SH 27X BRD (SUTURE) ×1 IMPLANT
SUT VIC AB 4-0 BRD 54 (SUTURE) IMPLANT
SYR BULB 3OZ (MISCELLANEOUS) IMPLANT
SYR CONTROL 10ML LL (SYRINGE) ×2 IMPLANT
TOWEL OR 17X24 6PK STRL BLUE (TOWEL DISPOSABLE) ×2 IMPLANT
TOWEL OR NON WOVEN STRL DISP B (DISPOSABLE) IMPLANT
TUBE CONNECTING 20X1/4 (TUBING) IMPLANT
YANKAUER SUCT BULB TIP NO VENT (SUCTIONS) IMPLANT

## 2013-01-10 NOTE — Interval H&P Note (Signed)
History and Physical Interval Note:  01/10/2013 1:33 PM  Brittany Colon  has presented today for surgery, with the diagnosis of DCIS right breast  The various methods of treatment have been discussed with the patient and family. After consideration of risks, benefits and other options for treatment, the patient has consented to  Procedure(s): BREAST LUMPECTOMY WITH NEEDLE LOCALIZATION (Right) as a surgical intervention .  The patient's history has been reviewed, patient examined, no change in status, stable for surgery.  I have reviewed the patient's chart and labs.  Questions were answered to the patient's satisfaction.     [name] T

## 2013-01-10 NOTE — Anesthesia Preprocedure Evaluation (Signed)
Anesthesia Evaluation  Patient identified by MRN, date of birth, ID band Patient awake    Reviewed: Allergy & Precautions, H&P , NPO status , Patient's Chart, lab work & pertinent test results  Airway Mallampati: II  Neck ROM: full    Dental   Pulmonary          Cardiovascular     Neuro/Psych  Headaches,    GI/Hepatic   Endo/Other    Renal/GU      Musculoskeletal   Abdominal   Peds  Hematology   Anesthesia Other Findings   Reproductive/Obstetrics                           Anesthesia Physical Anesthesia Plan  ASA: II  Anesthesia Plan: General   Post-op Pain Management:    Induction: Intravenous  Airway Management Planned: LMA  Additional Equipment:   Intra-op Plan:   Post-operative Plan:   Informed Consent: I have reviewed the patients History and Physical, chart, labs and discussed the procedure including the risks, benefits and alternatives for the proposed anesthesia with the patient or authorized representative who has indicated his/her understanding and acceptance.     Plan Discussed with: CRNA, Anesthesiologist and Surgeon  Anesthesia Plan Comments:         Anesthesia Quick Evaluation  

## 2013-01-10 NOTE — Anesthesia Procedure Notes (Signed)
Procedure Name: LMA Insertion Performed by: Alvis Edgell W Pre-anesthesia Checklist: Patient identified, Timeout performed, Emergency Drugs available, Suction available and Patient being monitored Patient Re-evaluated:Patient Re-evaluated prior to inductionOxygen Delivery Method: Circle system utilized Preoxygenation: Pre-oxygenation with 100% oxygen Intubation Type: IV induction Ventilation: Mask ventilation without difficulty LMA: LMA inserted LMA Size: 4.0 Number of attempts: 1 Placement Confirmation: breath sounds checked- equal and bilateral and positive ETCO2 Tube secured with: Tape Dental Injury: Teeth and Oropharynx as per pre-operative assessment      

## 2013-01-10 NOTE — Anesthesia Postprocedure Evaluation (Signed)
Anesthesia Post Note  Patient: Brittany Colon  Procedure(s) Performed: Procedure(s) (LRB): BREAST LUMPECTOMY WITH NEEDLE LOCALIZATION (Right)  Anesthesia type: General  Patient location: PACU  Post pain: Pain level controlled and Adequate analgesia  Post assessment: Post-op Vital signs reviewed, Patient's Cardiovascular Status Stable, Respiratory Function Stable, Patent Airway and Pain level controlled  Last Vitals:  Filed Vitals:   01/10/13 1500  BP: 118/64  Pulse: 75  Temp:   Resp: 15    Post vital signs: Reviewed and stable  Level of consciousness: awake, alert  and oriented  Complications: No apparent anesthesia complications

## 2013-01-10 NOTE — Transfer of Care (Signed)
Immediate Anesthesia Transfer of Care Note  Patient: Brittany Colon  Procedure(s) Performed: Procedure(s): BREAST LUMPECTOMY WITH NEEDLE LOCALIZATION (Right)  Patient Location: PACU  Anesthesia Type:General  Level of Consciousness: sedated and patient cooperative  Airway & Oxygen Therapy: Patient Spontanous Breathing and Patient connected to face mask oxygen  Post-op Assessment: Report given to PACU RN and Post -op Vital signs reviewed and stable  Post vital signs: Reviewed and stable  Complications: No apparent anesthesia complications

## 2013-01-10 NOTE — H&P (View-Only) (Signed)
Subjective:   new diagnosis DCIS right breast  Patient ID: Brittany Colon, female   DOB: 01/25/1961, 52 y.o.   MRN: [id number]  HPI Patient is a very pleasant 52-year-old female seen in the breast multidisciplinary clinic for a new diagnosis of ductal carcinoma in situ of the right breast. The patient recently presented for followup mammogram and was found to have a tightly clustered area of abnormal microcalcifications in the upper central right breast measuring 7 mm in diameter. Large core needle biopsy was recommended and performed. I've reviewed pathology which has shown high-grade ductal carcinoma in situ with necrosis. She subsequently has had bilateral breast MRI which has revealed a small infiltrate in the area of the biopsy but no abnormal enhancement. There are multiple cysts throughout both breasts. The patient had not noted any change in her breast exam prior to her diagnosis. She has chronically "lumpy" breasts. She has had no unusual pain or nipple discharge or bleeding or skin changes.  Past Medical History  Diagnosis Date  . Breast cancer   . Headache    Past Surgical History  Procedure Laterality Date  . Carpal tunnel release Right    Current Outpatient Prescriptions  Medication Sig Dispense Refill  . glucosamine-chondroitin 500-400 MG tablet Take 1 tablet by mouth daily.      . naproxen sodium (ANAPROX) 220 MG tablet Take 220 mg by mouth as needed.       No current facility-administered medications for this visit.   Allergies  Allergen Reactions  . Sulfur     vomiting   History  Substance Use Topics  . Smoking status: Never Smoker   . Smokeless tobacco: Not on file  . Alcohol Use: No  She works as a schoolteacher.   Review of Systems  Constitutional: Negative.   HENT: Negative.   Respiratory: Negative.   Cardiovascular: Negative.   Gastrointestinal: Negative.        Has never had colonoscopy.  Musculoskeletal: Negative.   Neurological: Negative.       Objective:   Physical Exam General: Well-developed healthy-appearing African American female in no distress Skin: No rash or infection HEENT: No palpable masses or thyromegaly. Pupils equal and reactive. Sclerae clear. Lymph nodes: No cervical, supraclavicular or axillary nodes palpable Breasts: Slight swelling in the upper central right breast at the core biopsy site. There is some irregular breast tissue bilaterally. No skin or nipple changes. Again no axillary nodes palpable Lungs: Clear equal breath sounds bilaterally without wheezing agree for breathing Cardiac: Regular rate and rhythm without murmur. No edema. Abdomen: Soft and nontender. No masses or organomegaly. Extremities: No joint swelling or edema or deformity Neurologic: Alert and fully oriented. Affect normal. Gait normal.    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 We discussed in detail initial surgical treatment options. She certainly would be a good candidate for breast conservation which is what she would prefer. We discussed mastectomy as an alternative which would not require radiation but she does prefer minimal surgery. We discussed needle localized lumpectomy as an outpatient. We discussed the rationale for the procedure and its nature in recovery as well as risks of anesthetic complications, bleeding, infection and possible need for further surgery based on final pathology.    Plan:     Needle localized right breast lumpectomy under general anesthesia an outpatient      

## 2013-01-10 NOTE — Op Note (Signed)
Preoperative Diagnosis: DCIS right breast  Postoprative Diagnosis: DCIS right breast  Procedure: Procedure(s): BREAST LUMPECTOMY WITH NEEDLE LOCALIZATION   Surgeon: Glenna Fellows T   Assistants: *None  Anesthesia:  General LMA anesthesia  Indications:   Patient is a 52 year old female who on recent screening mammogram was found to have a new 7 mm cluster of microcalcifications in the upper outer right breast. Port core needle biopsy revealed high-grade ductal carcinoma in situ. Subsequent MRI has shown only a biopsy cavity. After extensive discussion of treatment options and risks which are detailed elsewhere we elect to proceed with needle localized right breast lumpectomy.  Procedure Detail:  Following successful needle localization, the patient is brought to the operating room, placed in the supine position on the operating table and laryngeal mask general anesthesia induced. The right breast was widely sterilely prepped and draped. She received preoperative IV antibiotics. Patient Timeout was performed incorrect procedure verified. A curvilinear incision was made just inferior to the wire insertion site and dissected carried into the subcutaneous tissue. The lesion appeared to be superficial and short the skin and subcutaneous flaps were raised and the wire brought into the incision. Using cautery I then excised a reasonably generous specimen of breast tissue around the shaft and the tip of the wire in an effort to obtain a negative margin. The breast tissue was extremely dense. The specimen was removed and oriented with a consent for permanent pathology. Prior to sending a specimen mammography was obtained which showed the clip and the wire apparently in the center of the specimen. The soft tissues infiltrated with Marcaine. Hemostasis was assured. The breast and subcutaneous tissue were closed in layers with interrupted 3-0 Vicryl after marking the cavity with clips. The skin was closed  with subcuticular 5-0 Monocryl and Dermabond. Sponge needle and attention counts were correct.   Estimated Blood Loss:  Minimal         Drains: none  Blood Given: none          Specimens: right breast tissue        Complications:  * No complications entered in OR log *         Disposition: PACU - hemodynamically stable.         Condition: stable

## 2013-01-11 ENCOUNTER — Encounter (HOSPITAL_BASED_OUTPATIENT_CLINIC_OR_DEPARTMENT_OTHER): Payer: Self-pay | Admitting: General Surgery

## 2013-01-13 ENCOUNTER — Telehealth (INDEPENDENT_AMBULATORY_CARE_PROVIDER_SITE_OTHER): Payer: Self-pay | Admitting: General Surgery

## 2013-01-13 NOTE — Telephone Encounter (Signed)
Call the patient with pathology report, all margins negative

## 2013-01-24 ENCOUNTER — Encounter: Payer: Self-pay | Admitting: *Deleted

## 2013-01-24 DIAGNOSIS — C50919 Malignant neoplasm of unspecified site of unspecified female breast: Secondary | ICD-10-CM | POA: Insufficient documentation

## 2013-01-24 NOTE — Progress Notes (Signed)
Location of Breast Cancer: right, 12 o'clock  Histology per Pathology Report: high grade DCIS w/microcalcifications, necrosis  Receptor Status: ER(+), PR (+), Her2-neu ()  Did patient present with symptoms (if so, please note symptoms) or was this found on screening mammography?: ROUTINE MAMMOGRAM SCREEN  Past/Anticipated interventions by surgeon, if any: 01/10/13 RIGHT LUMPECTOMY  Past/Anticipated interventions by medical oncology, if any: consider systemic adjuvant antiestrogen therapy s/p radiation, Dr Darnelle Catalan  Lymphedema issues, if any:  none  Pain issues, if any:    SAFETY ISSUES:  Prior radiation? no  Pacemaker/ICD? no  Possible current pregnancy?   Is the patient on methotrexate? 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

## 2013-01-26 ENCOUNTER — Encounter: Payer: Self-pay | Admitting: *Deleted

## 2013-01-26 ENCOUNTER — Ambulatory Visit
Admission: RE | Admit: 2013-01-26 | Discharge: 2013-01-26 | Disposition: A | Discharge status: Home / Self Care | Payer: BC Managed Care – PPO | Source: Ambulatory Visit | Attending: Radiation Oncology | Admitting: Radiation Oncology

## 2013-01-26 ENCOUNTER — Encounter: Payer: Self-pay | Admitting: Radiation Oncology

## 2013-01-26 VITALS — BP 105/80 | HR 80 | Temp 97.8°F | Resp 20 | Ht 62.5 in | Wt 169.8 lb

## 2013-01-26 DIAGNOSIS — C50111 Malignant neoplasm of central portion of right female breast: Secondary | ICD-10-CM

## 2013-01-26 DIAGNOSIS — N6019 Diffuse cystic mastopathy of unspecified breast: Secondary | ICD-10-CM | POA: Insufficient documentation

## 2013-01-26 DIAGNOSIS — Z170 Estrogen receptor positive status [ER+]: Secondary | ICD-10-CM | POA: Insufficient documentation

## 2013-01-26 DIAGNOSIS — D0590 Unspecified type of carcinoma in situ of unspecified breast: Secondary | ICD-10-CM | POA: Insufficient documentation

## 2013-01-26 NOTE — Progress Notes (Signed)
Please see the Nurse Progress Note in the MD Initial Consult Encounter for this patient. 

## 2013-01-26 NOTE — Progress Notes (Unsigned)
Location of Breast CancerRight, upper outer  Histology per Pathology Report: Breast, right,: 12/14/12: needle core biopsy, 12 o'clock, 7 cm / nippleHIGH GRADE DUCTAL CARCINOMA IN SITU WITH CALCIFICATIONS AND NECROSIS. FIBROCYSTIC CHANGES WITH CALCIFICATIONS. 1 of Receptor Status: ER+, PR (+), Her2-neu ()  Did patient present with symptoms (if so, please note symptoms) or was this found on screening mammography?: mammogram  .01/10/13: Breast, lumpectomy, Right - DUCTAL CARCINOMA IN SITU WITH CALCIFICATIONS, LOW GRADE, SPANNING 0.4 CM - LOBULAR NEOPLASIA (ATYPICAL LOBULAR HYPERPLASIA). - THE SURGICAL RESECTION MARGINS ARE NEGATIVE FOR CARCINOMA.Dr. Glenna Fellows   Past/Anticipated interventions by medical oncology, if any: Breast Clinic ,12/18/12, Dr. Wayne Both, consider B-43 study  appt to see patient again mid July Lymphedema issues, if any:  No Pain issues, if any: no  SAFETY ISSUES:  Prior radiation?  NOPacemaker/ICD? NO  Possible current pregnancy?NO  Is the patient on methotrexate? 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

## 2013-01-26 NOTE — Progress Notes (Signed)
Radiation Oncology         [phone]) [phone] ________________________________  Name: Brittany Colon MRN: [id number]  Date: 01/26/2013  DOB: 1961-02-08  Reevaluation note  CC: Thayer Headings, MD  Hoxworth, Lorne Skeens, MD, Magrinat, Raymond Gurney, MD  Diagnosis:   Intraductal carcinoma of the right breast  Narrative:  The patient returns today for further evaluation. The patient was initially seen in the multidisciplinary breast clinic on 12/22/2012. Since that time the patient has undergone her definitive surgery where the patient underwent a breast lumpectomy with needle localization by Dr. Johna Sheriff. She has been doing well since her surgery.   pathology from the lumpectomy revealed ductal carcinoma in situ with calcifications, low-grade spanning over 0.4 cm. The surgical margins were clear with the closest margin being 0.4 cm (anterior).   the tumor was estrogen receptor positive at 100% and progesterone receptor positive at 100%. The patient is now seen for further discussion of breast conserving therapy.                             ALLERGIES:  is allergic to sulfur.  Meds: Current Outpatient Prescriptions  Medication Sig Dispense Refill  . glucosamine-chondroitin 500-400 MG tablet Take 1 tablet by mouth daily.      . naproxen sodium (ANAPROX) 220 MG tablet Take 220 mg by mouth as needed.      Marland Kitchen HYDROcodone-acetaminophen (NORCO/VICODIN) 5-325 MG per tablet Take 1-2 tablets by mouth every 4 (four) hours as needed for pain.  20 tablet  1   No current facility-administered medications for this encounter.    Physical Findings: The patient is in no acute distress. Patient is alert and oriented.  height is 5' 2.5" (1.588 m) and weight is 169 lb 12.8 oz (77.021 kg). Her oral temperature is 97.8 F (36.6 C). Her blood pressure is 105/80 and her pulse is 80. Her respiration is 20. Marland Kitchen  No palpable supraclavicular or axillary adenopathy. The lungs are clear to auscultation. The heart has a regular rhythm  and rate. Examination of the left breast reveals significant fibrocystic disease laterally. Examination of the right breast area reveals a small scar in the 11:00 position of the breast. The scar is healing well without signs of drainage or infection. The medial aspect of the right breast and centrally shows significant fibrocystic disease.  Lab Findings: Lab Results  Component Value Date   WBC 5.4 12/22/2012   HGB 12.8 01/10/2013   HCT 37.8 12/22/2012   MCV 89.1 12/22/2012   PLT 241 12/22/2012    @LASTCHEM @  Radiographic Findings: Mm Rt Plc Breast Loc Dev   1st Lesion  Inc Mammo Guide  01/10/2013   *RADIOLOGY REPORT*  Clinical Data:  Right breast cancer for surgical excision.  NEEDLE LOCALIZATION WITH MAMMOGRAPHIC GUIDANCE AND SPECIMEN RADIOGRAPH  Comparison:  Previous exams.  Patient presents for needle localization prior to right breast surgery.  I met with the patient and we discussed the procedure of needle localization including benefits and alternatives. We discussed the high likelihood of a successful procedure. We discussed the risks of the procedure, including infection, bleeding, tissue injury, and further surgery. Informed, written consent was given.  Using mammographic guidance, sterile technique, 2% lidocaine and a 5 cm modified Kopans needle, biopsy clip with calcifications in the upper right breast localized using a cranial approach.  The films are marked for Dr. Johna Sheriff.  Specimen radiograph was performed at [facility], and confirms biopsy clip, calcifications, wire  present in the tissue sample. The specimen is marked for pathology.  IMPRESSION: Needle localization right breast.  No apparent complications.   Original Report Authenticated By: Sherian Rein, M.D.    Impression:  Intraductal carcinoma the right breast. The initial biopsy showed high-grade however on final pathology this was noted to be low-grade. The patient is interested in participation in B- 43 and will meet with the  research nurse this morning for further discussion of this study. The patient would be an excellent candidate for breast conserving therapy with radiation as directed at the right breast area. I discussed the overall treatment course side effects and potential toxicities of radiation therapy in this situation with Mrs. Lowrey. She appears to understand and wishes to proceed with planned course of treatment.  Plan:  Simulation and planning on June 10 at 8 AM.  _____________________________________  -----------------------------------  Billie Lade, PhD, MD

## 2013-02-01 ENCOUNTER — Ambulatory Visit
Admission: RE | Admit: 2013-02-01 | Discharge: 2013-02-01 | Disposition: A | Discharge status: Home / Self Care | Payer: BC Managed Care – PPO | Source: Ambulatory Visit | Attending: Radiation Oncology | Admitting: Radiation Oncology

## 2013-02-01 DIAGNOSIS — Z510 Encounter for antineoplastic radiation therapy: Secondary | ICD-10-CM | POA: Insufficient documentation

## 2013-02-01 DIAGNOSIS — C50111 Malignant neoplasm of central portion of right female breast: Secondary | ICD-10-CM

## 2013-02-01 DIAGNOSIS — Y842 Radiological procedure and radiotherapy as the cause of abnormal reaction of the patient, or of later complication, without mention of misadventure at the time of the procedure: Secondary | ICD-10-CM | POA: Insufficient documentation

## 2013-02-01 DIAGNOSIS — D0590 Unspecified type of carcinoma in situ of unspecified breast: Secondary | ICD-10-CM | POA: Insufficient documentation

## 2013-02-01 DIAGNOSIS — L988 Other specified disorders of the skin and subcutaneous tissue: Secondary | ICD-10-CM | POA: Insufficient documentation

## 2013-02-03 ENCOUNTER — Ambulatory Visit (INDEPENDENT_AMBULATORY_CARE_PROVIDER_SITE_OTHER): Payer: BC Managed Care – PPO | Admitting: General Surgery

## 2013-02-03 ENCOUNTER — Encounter (INDEPENDENT_AMBULATORY_CARE_PROVIDER_SITE_OTHER): Payer: Self-pay | Admitting: General Surgery

## 2013-02-03 VITALS — BP 106/70 | HR 76 | Temp 97.6°F | Resp 15 | Ht 62.0 in | Wt 167.6 lb

## 2013-02-03 DIAGNOSIS — C50119 Malignant neoplasm of central portion of unspecified female breast: Secondary | ICD-10-CM

## 2013-02-03 DIAGNOSIS — C50111 Malignant neoplasm of central portion of right female breast: Secondary | ICD-10-CM

## 2013-02-03 NOTE — Progress Notes (Signed)
Chief complaint: Followup lobectomy  History: Patient returns for followup for right breast lumpectomy approximately 3 weeks ago for ductal carcinoma in situ. She reports some occasional shooting pains in the incision.  Exam: Her incision is healing nicely without evidence of complication.  Pathology which we had previously reviewed shows ductal carcinoma in situ and lobular neoplasia (atypical hyperplasia) with margins clear.  Assessment and plan: Doing well following lumpectomy for DCIS. She will start radiation treatment next week. I will see her for long-term followup in 6 months.

## 2013-02-03 NOTE — Progress Notes (Signed)
  Radiation Oncology         [phone] ________________________________  Name: Brittany Colon MRN: [id number]  Date: 02/01/2013  DOB: 1960-11-13  SIMULATION AND TREATMENT PLANNING NOTE  DIAGNOSIS: Intraductal carcinoma the right breast  NARRATIVE:  The patient was brought to the CT Simulation planning suite.  Identity was confirmed.  All relevant records and images related to the planned course of therapy were reviewed.  The patient freely provided informed written consent to proceed with treatment after reviewing the details related to the planned course of therapy. The consent form was witnessed and verified by the simulation staff.  Then, the patient was set-up in a stable reproducible  supine position for radiation therapy.  CT images were obtained.  Surface markings were placed.  The CT images were loaded into the planning software.  Then the target and avoidance structures were contoured.  Treatment planning then occurred.  The radiation prescription was entered and confirmed.  Then, I designed and supervised the construction of a total of 3 medically necessary complex treatment devices.  I have requested : Isodose Plan.  I have ordered:dose calc.  PLAN:  The patient will receive 50.4 Gy in 28 fractions, followed by a boost to the lumpectomy cavity for a dose of 62.4 gray.  ________________________________  -----------------------------------  Billie Lade, PhD, MD

## 2013-02-09 ENCOUNTER — Ambulatory Visit
Admission: RE | Admit: 2013-02-09 | Discharge: 2013-02-09 | Disposition: A | Discharge status: Home / Self Care | Payer: BC Managed Care – PPO | Source: Ambulatory Visit | Attending: Radiation Oncology | Admitting: Radiation Oncology

## 2013-02-09 ENCOUNTER — Ambulatory Visit: Payer: BC Managed Care – PPO | Admitting: Radiation Oncology

## 2013-02-09 DIAGNOSIS — C50111 Malignant neoplasm of central portion of right female breast: Secondary | ICD-10-CM

## 2013-02-09 NOTE — Progress Notes (Signed)
  Radiation Oncology         [phone] ________________________________  Name: Brittany Colon MRN: [id number]  Date: 02/09/2013  DOB: 1961/01/08  Simulation Verification Note  Status: outpatient  NARRATIVE: The patient was brought to the treatment unit and placed in the planned treatment position. The clinical setup was verified. Then port films were obtained and uploaded to the radiation oncology medical record software.  The treatment beams were carefully compared against the planned radiation fields. The position location and shape of the radiation fields was reviewed. They targeted volume of tissue appears to be appropriately covered by the radiation beams. Organs at risk appear to be excluded as planned.  Based on my personal review, I approved the simulation verification. The patient's treatment will proceed as planned.  -----------------------------------  Billie Lade, PhD, MD

## 2013-02-10 ENCOUNTER — Ambulatory Visit
Admission: RE | Admit: 2013-02-10 | Discharge: 2013-02-10 | Disposition: A | Discharge status: Home / Self Care | Payer: BC Managed Care – PPO | Source: Ambulatory Visit | Attending: Radiation Oncology | Admitting: Radiation Oncology

## 2013-02-11 ENCOUNTER — Ambulatory Visit: Payer: BC Managed Care – PPO

## 2013-02-14 ENCOUNTER — Ambulatory Visit: Payer: BC Managed Care – PPO

## 2013-02-14 ENCOUNTER — Ambulatory Visit: Admission: RE | Admit: 2013-02-14 | Payer: BC Managed Care – PPO | Source: Ambulatory Visit

## 2013-02-15 ENCOUNTER — Ambulatory Visit
Admission: RE | Admit: 2013-02-15 | Discharge: 2013-02-15 | Disposition: A | Discharge status: Home / Self Care | Payer: BC Managed Care – PPO | Source: Ambulatory Visit | Attending: Radiation Oncology | Admitting: Radiation Oncology

## 2013-02-15 VITALS — BP 123/63 | HR 79 | Temp 98.4°F | Ht 62.0 in | Wt 172.0 lb

## 2013-02-15 DIAGNOSIS — C50111 Malignant neoplasm of central portion of right female breast: Secondary | ICD-10-CM

## 2013-02-15 MED ORDER — ALRA NON-METALLIC DEODORANT (RAD-ONC)
1.0000 "application " | Freq: Once | TOPICAL | Status: AC
  Administered 2013-02-15: 1 via TOPICAL

## 2013-02-15 MED ORDER — RADIAPLEXRX EX GEL
Freq: Once | CUTANEOUS | Status: AC
  Administered 2013-02-15: 12:00:00 via TOPICAL

## 2013-02-15 NOTE — Progress Notes (Signed)
[facility]    Radiation Oncology [address] [place]     Maryln Gottron, M.D. [place], [place] [address]               Billie Lade, M.D., Ph.D. Phone: [phone]      Molli Hazard A. Kathrynn Running, M.D. Fax: [phone]      Radene Gunning, M.D., Ph.D.         Lurline Hare, M.D.         Grayland Jack, M.D Weekly Treatment Management Note  Name: Brittany Colon     MRN: [id number]        CSN: [id number] Date: 02/15/2013      DOB: Sep 24, 1960  CC: Thayer Headings, MD         Thea Silversmith    Status: Outpatient  Diagnosis: The encounter diagnosis was Cancer of central portion of female breast, right.  Current Dose: 5.4 Gy  Current Fraction: 3  Planned Dose: 62.4 Gy  Narrative: Paticia Colon was seen today for weekly treatment management. The chart was checked and port films  were reviewed. She is tolerating the treatments well at this time without any side effects.  Sulfur  No current outpatient prescriptions on file.   Current Facility-Administered Medications  Medication Dose Route Frequency Provider Last Rate Last Dose  . hyaluronate sodium (RADIAPLEXRX) gel   Topical Once Billie Lade, MD      . non-metallic deodorant Thornton Papas) 1 application  1 application Topical Once Billie Lade, MD       Labs:    Physical Examination:  height is 5\' 2"  (1.575 m) and weight is 172 lb (78.019 kg). Her temperature is 98.4 F (36.9 C). Her blood pressure is 123/63 and her pulse is 79.    Wt Readings from Last 3 Encounters:  02/15/13 172 lb (78.019 kg)  02/03/13 167 lb 9.6 oz (76.023 kg)  01/10/13 167 lb (75.751 kg)    The right breast area shows no appreciable radiation reaction at this time. Lungs - Normal respiratory effort, chest expands symmetrically. Lungs are clear to auscultation, no crackles or wheezes.  Heart has regular rhythm and rate  Abdomen is soft and non tender with normal bowel sounds  Assessment:  Patient tolerating treatments well  Plan:  Continue treatment per original radiation prescription

## 2013-02-15 NOTE — Progress Notes (Signed)
Brittany Colon is here for weekly under treat visit.  She has had 3 fractions to her right breast.  She denies pain except occasional sharp pains in her right breast.  Her skin is intact on her right breast.  She denies fatigue.  She was given the radiation therapy and you book and discussed the potential side effects of fatigue and skin changes.  She was given Alra Deoderant and radiaplex gel and was instructed to apply it twice a day after treatment and at bedtime.

## 2013-02-16 ENCOUNTER — Ambulatory Visit: Payer: BC Managed Care – PPO

## 2013-02-17 ENCOUNTER — Ambulatory Visit
Admission: RE | Admit: 2013-02-17 | Discharge: 2013-02-17 | Disposition: A | Discharge status: Home / Self Care | Payer: BC Managed Care – PPO | Source: Ambulatory Visit | Attending: Radiation Oncology | Admitting: Radiation Oncology

## 2013-02-18 ENCOUNTER — Ambulatory Visit
Admission: RE | Admit: 2013-02-18 | Discharge: 2013-02-18 | Disposition: A | Discharge status: Home / Self Care | Payer: BC Managed Care – PPO | Source: Ambulatory Visit | Attending: Radiation Oncology | Admitting: Radiation Oncology

## 2013-02-19 ENCOUNTER — Ambulatory Visit
Admission: RE | Admit: 2013-02-19 | Discharge: 2013-02-19 | Disposition: A | Discharge status: Home / Self Care | Payer: BC Managed Care – PPO | Source: Ambulatory Visit | Attending: Radiation Oncology | Admitting: Radiation Oncology

## 2013-02-21 ENCOUNTER — Encounter: Payer: Self-pay | Admitting: Radiation Oncology

## 2013-02-21 ENCOUNTER — Ambulatory Visit
Admission: RE | Admit: 2013-02-21 | Discharge: 2013-02-21 | Disposition: A | Discharge status: Home / Self Care | Payer: BC Managed Care – PPO | Source: Ambulatory Visit | Attending: Radiation Oncology | Admitting: Radiation Oncology

## 2013-02-21 VITALS — BP 107/67 | HR 75 | Resp 18 | Wt 171.4 lb

## 2013-02-21 DIAGNOSIS — C50111 Malignant neoplasm of central portion of right female breast: Secondary | ICD-10-CM

## 2013-02-21 NOTE — Progress Notes (Signed)
Reports normal activity energy level. Denies skin changes to left/treated breast. Reports using radiaplex and alra as directed. No complaints at this time.

## 2013-02-21 NOTE — Progress Notes (Signed)
[facility]    Radiation Oncology [address] [place]     Maryln Gottron, M.D. [place], [place] [address]               Billie Lade, M.D., Ph.D. Phone: [phone]      Molli Hazard A. Kathrynn Running, M.D. Fax: [phone]      Radene Gunning, M.D., Ph.D.         Lurline Hare, M.D.         Grayland Jack, M.D Weekly Treatment Management Note  Name: Brittany Colon     MRN: [id number]        CSN: [id number] Date: 02/21/2013      DOB: May 10, 1961  CC: Thayer Headings, MD         Thea Silversmith    Status: Outpatient  Diagnosis: The encounter diagnosis was Cancer of central portion of female breast, right.  Current Dose: 12.6 Gy  Current Fraction: 7  Planned Dose: 62.4 Gy  Narrative: [name] was seen today for weekly treatment management. The chart was checked and port films  were reviewed. She continues to tolerate the treatments well without side effects at this time.  Sulfur Current Outpatient Prescriptions  Medication Sig Dispense Refill  . non-metallic deodorant (ALRA) MISC Apply 1 application topically daily as needed.      . Wound Cleansers (RADIAPLEX EX) Apply topically.       No current facility-administered medications for this encounter.   Labs:  Lab Results  Component Value Date   WBC 5.4 12/22/2012   HGB 12.8 01/10/2013   HCT 37.8 12/22/2012   MCV 89.1 12/22/2012   PLT 241 12/22/2012   Lab Results  Component Value Date   CREATININE 0.8 12/22/2012   BUN 14.3 12/22/2012   NA 141 12/22/2012   K 4.1 12/22/2012   CL 107 12/22/2012   CO2 28 12/22/2012   Lab Results  Component Value Date   ALT 11 12/22/2012   AST 16 12/22/2012   BILITOT 0.28 12/22/2012    Physical Examination:  weight is 171 lb 6.4 oz (77.747 kg). Her blood pressure is 107/67 and her pulse is 75. Her respiration is 18.    Wt Readings from Last 3 Encounters:  02/21/13 171 lb 6.4 oz (77.747 kg)  02/15/13 172 lb (78.019 kg)  02/03/13 167 lb 9.6 oz (76.023 kg)    The right breast  area shows some mild hyperpigmentation changes Lungs - Normal respiratory effort, chest expands symmetrically. Lungs are clear to auscultation, no crackles or wheezes.  Heart has regular rhythm and rate  Abdomen is soft and non tender with normal bowel sounds  Assessment:  Patient tolerating treatments well  Plan: Continue treatment per original radiation prescription

## 2013-02-22 ENCOUNTER — Ambulatory Visit
Admission: RE | Admit: 2013-02-22 | Discharge: 2013-02-22 | Disposition: A | Discharge status: Home / Self Care | Payer: BC Managed Care – PPO | Source: Ambulatory Visit | Attending: Radiation Oncology | Admitting: Radiation Oncology

## 2013-02-23 ENCOUNTER — Ambulatory Visit
Admission: RE | Admit: 2013-02-23 | Discharge: 2013-02-23 | Disposition: A | Discharge status: Home / Self Care | Payer: BC Managed Care – PPO | Source: Ambulatory Visit | Attending: Radiation Oncology | Admitting: Radiation Oncology

## 2013-02-24 ENCOUNTER — Ambulatory Visit
Admission: RE | Admit: 2013-02-24 | Discharge: 2013-02-24 | Disposition: A | Discharge status: Home / Self Care | Payer: BC Managed Care – PPO | Source: Ambulatory Visit | Attending: Radiation Oncology | Admitting: Radiation Oncology

## 2013-02-28 ENCOUNTER — Ambulatory Visit
Admission: RE | Admit: 2013-02-28 | Discharge: 2013-02-28 | Disposition: A | Discharge status: Home / Self Care | Payer: BC Managed Care – PPO | Source: Ambulatory Visit | Attending: Radiation Oncology | Admitting: Radiation Oncology

## 2013-03-01 ENCOUNTER — Ambulatory Visit
Admission: RE | Admit: 2013-03-01 | Discharge: 2013-03-01 | Disposition: A | Discharge status: Home / Self Care | Payer: BC Managed Care – PPO | Source: Ambulatory Visit | Attending: Radiation Oncology | Admitting: Radiation Oncology

## 2013-03-01 ENCOUNTER — Encounter: Payer: Self-pay | Admitting: Radiation Oncology

## 2013-03-01 VITALS — BP 116/75 | HR 90 | Temp 98.0°F | Ht 62.0 in | Wt 171.2 lb

## 2013-03-01 DIAGNOSIS — C50111 Malignant neoplasm of central portion of right female breast: Secondary | ICD-10-CM

## 2013-03-01 MED ORDER — BIAFINE EX EMUL
CUTANEOUS | Status: DC | PRN
  Administered 2013-03-01: 18:00:00 via TOPICAL

## 2013-03-01 NOTE — Progress Notes (Signed)
[facility]    Radiation Oncology [address] [place]     Maryln Gottron, M.D. [place], [place] [address]               Billie Lade, M.D., Ph.D. Phone: [phone]      Molli Hazard A. Kathrynn Running, M.D. Fax: [phone]      Radene Gunning, M.D., Ph.D.         Lurline Hare, M.D.         Grayland Jack, M.D Weekly Treatment Management Note  Name: Brittany Colon     MRN: [id number]        CSN: [id number] Date: 03/01/2013      DOB: 08-18-1961  CC: Thayer Headings, MD         Thea Silversmith    Status: Outpatient  Diagnosis: The encounter diagnosis was Cancer of central portion of female breast, right.  Current Dose: 21.6 Gy  Current Fraction: 12  Planned Dose: 62.4 Gy  Narrative: Brittany Colon was seen today for weekly treatment management. The chart was checked and port films  were reviewed. She is starting to have some soreness as well as itching within the breast area. Her nipple area is particularly sore. Patient will be switched to Biafine  Sulfur Current Outpatient Prescriptions  Medication Sig Dispense Refill  . non-metallic deodorant (ALRA) MISC Apply 1 application topically daily as needed.      . Wound Cleansers (RADIAPLEX EX) Apply topically.       No current facility-administered medications for this encounter.      Physical Examination:  height is 5\' 2"  (1.575 m) and weight is 171 lb 3.2 oz (77.656 kg). Her temperature is 98 F (36.7 C). Her blood pressure is 116/75 and her pulse is 90.    Wt Readings from Last 3 Encounters:  03/01/13 171 lb 3.2 oz (77.656 kg)  02/21/13 171 lb 6.4 oz (77.747 kg)  02/15/13 172 lb (78.019 kg)    The right breast area shows some hyperpigmentation changes and erythema. Lungs - Normal respiratory effort, chest expands symmetrically. Lungs are clear to auscultation, no crackles or wheezes.  Heart has regular rhythm and rate  Abdomen is soft and non tender with normal bowel sounds  Assessment:  Patient tolerating  treatments well  Plan: Continue treatment per original radiation prescription

## 2013-03-01 NOTE — Progress Notes (Signed)
Brittany Colon has received 12 fractions to her right breast.  She c/o burning and itching of breast with irritation of her nipple.  Breast warm to touch.  Note erythema and hyperpigmentation of there breast.  Switched to Biafine

## 2013-03-01 NOTE — Addendum Note (Signed)
Encounter addended by: Delynn Flavin, RN on: 03/01/2013  5:38 PM<BR>     Documentation filed: Inpatient MAR, Orders

## 2013-03-02 ENCOUNTER — Ambulatory Visit
Admission: RE | Admit: 2013-03-02 | Discharge: 2013-03-02 | Disposition: A | Discharge status: Home / Self Care | Payer: BC Managed Care – PPO | Source: Ambulatory Visit | Attending: Radiation Oncology | Admitting: Radiation Oncology

## 2013-03-03 ENCOUNTER — Ambulatory Visit
Admission: RE | Admit: 2013-03-03 | Discharge: 2013-03-03 | Disposition: A | Discharge status: Home / Self Care | Payer: BC Managed Care – PPO | Source: Ambulatory Visit | Attending: Radiation Oncology | Admitting: Radiation Oncology

## 2013-03-04 ENCOUNTER — Ambulatory Visit
Admission: RE | Admit: 2013-03-04 | Discharge: 2013-03-04 | Disposition: A | Discharge status: Home / Self Care | Payer: BC Managed Care – PPO | Source: Ambulatory Visit | Attending: Radiation Oncology | Admitting: Radiation Oncology

## 2013-03-05 ENCOUNTER — Encounter: Payer: Self-pay | Admitting: *Deleted

## 2013-03-05 NOTE — Progress Notes (Signed)
Mailed after appt letter to pt. 

## 2013-03-07 ENCOUNTER — Ambulatory Visit
Admission: RE | Admit: 2013-03-07 | Discharge: 2013-03-07 | Disposition: A | Discharge status: Home / Self Care | Payer: BC Managed Care – PPO | Source: Ambulatory Visit | Attending: Radiation Oncology | Admitting: Radiation Oncology

## 2013-03-07 ENCOUNTER — Encounter: Payer: Self-pay | Admitting: Radiation Oncology

## 2013-03-07 NOTE — Progress Notes (Signed)
Patient stopped by today concerned about getting bills paid.  Went over options and gave patient Access One application, MCD application, hardship application.  She could potentially have over $10,000 in balances if her [facility] Plan coverage started over again 02/22/13.  She currently has to meet $6,240 in OOP expenses and over $1700 in coinsurance.

## 2013-03-08 ENCOUNTER — Ambulatory Visit
Admission: RE | Admit: 2013-03-08 | Discharge: 2013-03-08 | Disposition: A | Discharge status: Home / Self Care | Payer: BC Managed Care – PPO | Source: Ambulatory Visit | Attending: Radiation Oncology | Admitting: Radiation Oncology

## 2013-03-08 VITALS — BP 108/60 | HR 78 | Temp 98.4°F | Ht 62.0 in | Wt 170.9 lb

## 2013-03-08 DIAGNOSIS — C50111 Malignant neoplasm of central portion of right female breast: Secondary | ICD-10-CM

## 2013-03-08 MED ORDER — BIAFINE EX EMUL
Freq: Two times a day (BID) | CUTANEOUS | Status: DC
  Administered 2013-03-08: 12:00:00 via TOPICAL

## 2013-03-08 NOTE — Addendum Note (Signed)
Encounter addended by: Eduardo Osier, RN on: 03/08/2013 12:08 PM<BR>     Documentation filed: Inpatient MAR

## 2013-03-08 NOTE — Progress Notes (Signed)
Brittany Colon here for weekly under treat visit.  She has had 17 fractions to her right breast.  She says that she had a very sharp and intense pain in her right breast on Sunday that brought tears to her eyes.  She has had more sharp pains since then but they are not as intense.  The skin on her right breast has hyperpigmentation.  She states that it is itching.  She is using biafine cream twice a day.

## 2013-03-08 NOTE — Progress Notes (Signed)
[facility]    Radiation Oncology [address] [place]     Maryln Gottron, M.D. [place], [place] [address]               Billie Lade, M.D., Ph.D. Phone: [phone]      Molli Hazard A. Kathrynn Running, M.D. Fax: [phone]      Radene Gunning, M.D., Ph.D.         Lurline Hare, M.D.         Grayland Jack, M.D Weekly Treatment Management Note  Name: Brittany Colon     MRN: [id number]        CSN: [id number] Date: 03/08/2013      DOB: 07/17/1961  CC: Thayer Headings, MD         Thea Silversmith    Status: Outpatient  Diagnosis: The encounter diagnosis was Cancer of central portion of female breast, right.  Current Dose: 30.6 Gy  Current Fraction: 17  Planned Dose: 62.4 Gy  Narrative: [name] was seen today for weekly treatment management. The chart was checked and port films  were reviewed. She is starting to have some itching within the breast area. She is using Biafine and have recommended hydrocortisone cream on occasion. Over the weekend the patient developed an intense sharp pain in the nipple area which lasted for approximately 30 seconds. She also developed one additional episode of this sharp pain but none since.  Sulfur  Current Outpatient Prescriptions  Medication Sig Dispense Refill  . emollient (BIAFINE) cream Apply topically 2 (two) times daily.      . non-metallic deodorant Thornton Papas) MISC Apply 1 application topically daily as needed.       No current facility-administered medications for this encounter.   Labs:  Lab Results  Component Value Date   WBC 5.4 12/22/2012   HGB 12.8 01/10/2013   HCT 37.8 12/22/2012   MCV 89.1 12/22/2012   PLT 241 12/22/2012   Lab Results  Component Value Date   CREATININE 0.8 12/22/2012   BUN 14.3 12/22/2012   NA 141 12/22/2012   K 4.1 12/22/2012   CL 107 12/22/2012   CO2 28 12/22/2012   Lab Results  Component Value Date   ALT 11 12/22/2012   AST 16 12/22/2012   BILITOT 0.28 12/22/2012    Physical Examination:   height is 5\' 2"  (1.575 m) and weight is 170 lb 14.4 oz (77.52 kg). Her temperature is 98.4 F (36.9 C). Her blood pressure is 108/60 and her pulse is 78.    Wt Readings from Last 3 Encounters:  03/08/13 170 lb 14.4 oz (77.52 kg)  03/01/13 171 lb 3.2 oz (77.656 kg)  02/21/13 171 lb 6.4 oz (77.747 kg)    The right breast area shows hyperpigmentation changes. Lungs - Normal respiratory effort, chest expands symmetrically. Lungs are clear to auscultation, no crackles or wheezes.  Heart has regular rhythm and rate  Abdomen is soft and non tender with normal bowel sounds  Assessment:  Patient tolerating treatments well except for issues as above  Plan: Continue treatment per original radiation prescription

## 2013-03-09 ENCOUNTER — Ambulatory Visit
Admission: RE | Admit: 2013-03-09 | Discharge: 2013-03-09 | Disposition: A | Discharge status: Home / Self Care | Payer: BC Managed Care – PPO | Source: Ambulatory Visit | Attending: Radiation Oncology | Admitting: Radiation Oncology

## 2013-03-10 ENCOUNTER — Ambulatory Visit
Admission: RE | Admit: 2013-03-10 | Discharge: 2013-03-10 | Disposition: A | Discharge status: Home / Self Care | Payer: BC Managed Care – PPO | Source: Ambulatory Visit | Attending: Radiation Oncology | Admitting: Radiation Oncology

## 2013-03-11 ENCOUNTER — Ambulatory Visit
Admission: RE | Admit: 2013-03-11 | Discharge: 2013-03-11 | Disposition: A | Discharge status: Home / Self Care | Payer: BC Managed Care – PPO | Source: Ambulatory Visit | Attending: Radiation Oncology | Admitting: Radiation Oncology

## 2013-03-14 ENCOUNTER — Ambulatory Visit (HOSPITAL_BASED_OUTPATIENT_CLINIC_OR_DEPARTMENT_OTHER): Payer: BC Managed Care – PPO | Admitting: Oncology

## 2013-03-14 ENCOUNTER — Ambulatory Visit
Admission: RE | Admit: 2013-03-14 | Discharge: 2013-03-14 | Disposition: A | Discharge status: Home / Self Care | Payer: BC Managed Care – PPO | Source: Ambulatory Visit | Attending: Radiation Oncology | Admitting: Radiation Oncology

## 2013-03-14 ENCOUNTER — Other Ambulatory Visit (HOSPITAL_COMMUNITY): Payer: Self-pay | Admitting: Oncology

## 2013-03-14 VITALS — BP 106/68 | HR 81 | Temp 98.2°F | Resp 20 | Ht 62.0 in | Wt 170.1 lb

## 2013-03-14 DIAGNOSIS — Z170 Estrogen receptor positive status [ER+]: Secondary | ICD-10-CM

## 2013-03-14 DIAGNOSIS — C50119 Malignant neoplasm of central portion of unspecified female breast: Secondary | ICD-10-CM

## 2013-03-14 DIAGNOSIS — D0590 Unspecified type of carcinoma in situ of unspecified breast: Secondary | ICD-10-CM

## 2013-03-14 MED ORDER — TAMOXIFEN CITRATE 20 MG PO TABS: 20.0000 mg | ORAL_TABLET | Freq: Every day | ORAL | Status: DC

## 2013-03-14 NOTE — Progress Notes (Signed)
ID: Brittany Colon   DOB: 06/24/1961  MR#: [id number]  WUJ#:[id number]  PCP: Thayer Headings, MD GYN: Carrington Clamp SU: Glenna Fellows OTHER MD: Antony Blackbird   HISTORY OF PRESENT ILLNESS: "Brittany Colon" has lumpy breasts, and has had diagnostic mammography previously. Screening mammography 08/14/2011 suggested a possible change in the right breast, and diagnostic right breast mammography 09/09/2011 found a cluster of coarse calcifications superiorly in the right breast. These were felt to be likely benign, and six-month followup was recommended. However the next mammogram was 12/14/2012. This showed the calcifications in question to have increased since the prior study. The covered an area of 7 mm. 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  Bilateral breast MRI 12/18/2012 showed only post biopsy change and multiple breast cysts bilaterally. The patient's subsequent history is as detailed below  INTERVAL HISTORY: Brittany Colon returns for followup of her noninvasive breast cancer. Since her last visit here she had her definitive surgery. She tolerated that well, with no unusual fever, bleeding or pain.  REVIEW OF SYSTEMS: She did well with the first 2 weeks of radiation but now she is beginning to feel some fatigue. She's also very concerned about the hyperpigmentation. She has chronic mild insomnia and occasional headaches. She does not have hot flashes. Otherwise a detailed review of systems today was entirely noncontributory  PAST MEDICAL HISTORY: Past Medical History  Diagnosis Date  . Headache(784.0)   . Breast cancer 12/14/12    right  . Breast cyst 12/18/12    mult bilaterally per MRI  . Allergy     PAST SURGICAL HISTORY: Past Surgical History  Procedure Laterality Date  . Carpal tunnel release Right   . Foot surgery  1993    bone spur lt foot  . Breast lumpectomy with needle  localization Right 01/10/2013    Procedure: BREAST LUMPECTOMY WITH NEEDLE LOCALIZATION;  Surgeon: Mariella Saa, MD;  Location: [facility];  Service: General;  Laterality: Right;    FAMILY HISTORY Family History  Problem Relation Age of Onset  . Heart disease Mother    The patient's father died from Parkinson's disease at age 27. The patient's mother died from a myocardial infarction at age 69. Lives has 3 brothers, one sister. There is no history of cancer in the family to her knowledge.   GYNECOLOGIC HISTORY: Menarche age 30, first live birth age 64. She is GX P2. She still having regular periods. She never took birth control pills.   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 Her husband Brittany Colon is disabled secondary to sleep apnea and fibromyalgia. Daughter Brittany Colon works as a Engineer, site at [employer] son Brittany Colon is Clinical biochemist at [employer]. The patient has 1 grandchild. She attends a [employer] in [employer]   ADVANCED DIRECTIVES:Not in place   HEALTH MAINTENANCE: History  Substance Use Topics  . Smoking status: Never Smoker   . Smokeless tobacco: Not on file  . Alcohol Use: No     Colonoscopy:  PAP:  Bone density:  Lipid panel:  Allergies  Allergen Reactions  . Sulfur     vomiting    Current Outpatient Prescriptions  Medication Sig Dispense Refill  . emollient (BIAFINE) cream Apply topically 2 (two) times daily.      . non-metallic deodorant Thornton Papas) MISC Apply 1 application topically daily as needed.  No current facility-administered medications for this visit.    OBJECTIVE: Middle-aged [place] American woman in no acute distress Filed Vitals:   03/14/13 1448  BP: 106/68  Pulse: 81  Temp: 98.2 F (36.8 C)  Resp: 20     Body mass index is 31.1 kg/(m^2).    ECOG FS: 1  Sclerae unicteric Oropharynx clear No cervical or supraclavicular  adenopathy Lungs no rales or rhonchi Heart regular rate and rhythm Abd benign MSK no focal spinal tenderness, no peripheral edema Neuro: nonfocal, well oriented, appropriate affect  Breasts: The right breast is undergoing radiation. There is significant hyperpigmentation. There is no evidence of local recurrence. The right axilla is benign. The left breast is unremarkable.  LAB RESULTS: Lab Results  Component Value Date   WBC 5.4 12/22/2012   NEUTROABS 2.5 12/22/2012   HGB 12.8 01/10/2013   HCT 37.8 12/22/2012   MCV 89.1 12/22/2012   PLT 241 12/22/2012      Chemistry      Component Value Date/Time   NA 141 12/22/2012 1219   K 4.1 12/22/2012 1219   CL 107 12/22/2012 1219   CO2 28 12/22/2012 1219   BUN 14.3 12/22/2012 1219   CREATININE 0.8 12/22/2012 1219      Component Value Date/Time   CALCIUM 9.2 12/22/2012 1219   ALKPHOS 61 12/22/2012 1219   AST 16 12/22/2012 1219   ALT 11 12/22/2012 1219   BILITOT 0.28 12/22/2012 1219       No results found for this basename: LABCA2    No components found with this basename: LABCA125    No results found for this basename: INR,  in the last 168 hours  Urinalysis    Component Value Date/Time   LABSPEC 1.020 11/21/2008 1036   PHURINE 6.5 11/21/2008 1036   GLUCOSEU NEGATIVE 11/21/2008 1036   HGBUR NEGATIVE 11/21/2008 1036   BILIRUBINUR NEGATIVE 11/21/2008 1036   KETONESUR NEGATIVE 11/21/2008 1036   PROTEINUR NEGATIVE 11/21/2008 1036   UROBILINOGEN 0.2 11/21/2008 1036   NITRITE NEGATIVE 11/21/2008 1036   LEUKOCYTESUR NEGATIVE Biochemical Testing Only. Please order routine urinalysis from main lab if confirmatory testing is needed. 11/21/2008 1036    STUDIES: No results found.   ASSESSMENT: 52 y.o. [place] woman status post right breast upper outer quadrant lumpectomy 01/10/2013 for a high-grade ductal carcinoma in situ, 100% estrogen receptor positive, 100% progesterone receptor positive.  (1) adjuvant radiation to be completed  03/31/2013  (2) tamoxifen to be started Sept 2014    PLAN: We discussed her diagnosis again in detail and she understands she does not have life-threatening breast cancer. Radiation will decrease her risk of local recurrence by half. If she took tamoxifen for 5 years that would again cut the risk of local recurrence in half.  However since the risk of local recurrence is a ready very low, cutting it in half with tamoxifen is really not a major motivated her. A better reason to consider tamoxifen as preventative. Her risk of developing a new breast cancer Breast may be as high as 1% per year. Tamoxifen will cut that risk in half as well.  We discussed the possible toxicities, side effects and complications of tamoxifen and she is interested in giving it a try. I have asked her not to start until about a month after completing her radiation treatments, so we don't get overlapping symptoms. She is going to see [place] again in January. She knows to call for any problems that may develop before that visit.  However if she tolerates the tamoxifen well we will start seeing her on a once a year basis after her January visit.   Teofilo Lupinacci C    03/14/2013

## 2013-03-15 ENCOUNTER — Encounter: Payer: Self-pay | Admitting: Radiation Oncology

## 2013-03-15 ENCOUNTER — Ambulatory Visit
Admission: RE | Admit: 2013-03-15 | Discharge: 2013-03-15 | Disposition: A | Discharge status: Home / Self Care | Payer: BC Managed Care – PPO | Source: Ambulatory Visit | Attending: Radiation Oncology | Admitting: Radiation Oncology

## 2013-03-15 ENCOUNTER — Ambulatory Visit
Admission: RE | Admit: 2013-03-15 | Payer: BC Managed Care – PPO | Source: Ambulatory Visit | Admitting: Radiation Oncology

## 2013-03-15 VITALS — BP 116/70 | HR 83 | Resp 16 | Wt 169.3 lb

## 2013-03-15 DIAGNOSIS — C50111 Malignant neoplasm of central portion of right female breast: Secondary | ICD-10-CM

## 2013-03-15 NOTE — Progress Notes (Signed)
Patient dropped by [facility] denial letter

## 2013-03-15 NOTE — Progress Notes (Signed)
[facility]    Radiation Oncology [address] [place]     Brittany Colon, M.D. [place], [place] [address]               Brittany Colon, M.D., Ph.D. Phone: [phone]      Brittany Colon, M.D. Fax: [phone]      Brittany Colon, M.D., Ph.D.         Brittany Colon, M.D.         Brittany Colon, M.D Weekly Treatment Management Note  Name: Brittany Colon     MRN: [id number]        CSN: [id number] Date: 03/15/2013      DOB: 1960/12/16  CC: Brittany Headings, MD         Brittany Colon    Status: Outpatient  Diagnosis: The encounter diagnosis was Cancer of central portion of female breast, right.  Current Dose: 39.6 Gy  Current Fraction: 22  Planned Dose: 62.4 Gy  Narrative: Brittany Colon was seen today for weekly treatment management. The chart was checked and port films  were reviewed. She continues to tolerate her treatments well this time. She has minimal fatigue as well as some itching within the breast area. Her energy level is good.  Sulfur  Current Outpatient Prescriptions  Medication Sig Dispense Refill  . emollient (BIAFINE) cream Apply topically 2 (two) times daily.      . non-metallic deodorant Thornton Papas) MISC Apply 1 application topically daily as needed.      . tamoxifen (NOLVADEX) 20 MG tablet Take 1 tablet (20 mg total) by mouth daily.  90 tablet  12   No current facility-administered medications for this encounter.   Labs:    Physical Examination:  weight is 169 lb 4.8 oz (76.794 kg). Her blood pressure is 116/70 and her pulse is 83. Her respiration is 16.    Wt Readings from Last 3 Encounters:  03/15/13 169 lb 4.8 oz (76.794 kg)  03/14/13 170 lb 1.6 oz (77.157 kg)  03/08/13 170 lb 14.4 oz (77.52 kg)     the right breast area shows significant hyperpigmentation changes. Lungs - Normal respiratory effort, chest expands symmetrically. Lungs are clear to auscultation, no crackles or wheezes.  Heart has regular rhythm and rate  Abdomen is  soft and non tender with normal bowel sounds  Assessment:  Patient tolerating treatments well  Plan: Continue treatment per original radiation prescription

## 2013-03-15 NOTE — Progress Notes (Signed)
Denies pain in her right breast. Hyperpigmentation of right/treated breast without desquamation. Denies nipple discharge. Reports using biafine as directed. Denies fatigue. Has no complaints at this time.

## 2013-03-16 ENCOUNTER — Ambulatory Visit
Admission: RE | Admit: 2013-03-16 | Discharge: 2013-03-16 | Disposition: A | Discharge status: Home / Self Care | Payer: BC Managed Care – PPO | Source: Ambulatory Visit | Attending: Radiation Oncology | Admitting: Radiation Oncology

## 2013-03-16 ENCOUNTER — Ambulatory Visit: Payer: BC Managed Care – PPO | Admitting: Radiation Oncology

## 2013-03-17 ENCOUNTER — Ambulatory Visit
Admission: RE | Admit: 2013-03-17 | Discharge: 2013-03-17 | Disposition: A | Discharge status: Home / Self Care | Payer: BC Managed Care – PPO | Source: Ambulatory Visit | Attending: Radiation Oncology | Admitting: Radiation Oncology

## 2013-03-17 ENCOUNTER — Encounter: Payer: Self-pay | Admitting: Radiation Oncology

## 2013-03-17 NOTE — Progress Notes (Signed)
   Department of Radiation Oncology  Phone:  [phone] Fax:        [phone]  Electron beam simulation note  Today the patient underwent additional planning for radiation therapy directed at the breast area. The patient's treatment planning CT scan was reviewed and she had set up of a custom electron cutout field directed at the lumpectomy cavity within the right breast. The patient will be treated with 12 MeV electrons prescribed to the 92% isodose line. A special port plan is requested for treatment.  -----------------------------------  Billie Lade, PhD, MD

## 2013-03-18 ENCOUNTER — Ambulatory Visit
Admission: RE | Admit: 2013-03-18 | Discharge: 2013-03-18 | Disposition: A | Discharge status: Home / Self Care | Payer: BC Managed Care – PPO | Source: Ambulatory Visit | Attending: Radiation Oncology | Admitting: Radiation Oncology

## 2013-03-21 ENCOUNTER — Ambulatory Visit
Admission: RE | Admit: 2013-03-21 | Discharge: 2013-03-21 | Disposition: A | Discharge status: Home / Self Care | Payer: BC Managed Care – PPO | Source: Ambulatory Visit | Attending: Radiation Oncology | Admitting: Radiation Oncology

## 2013-03-22 ENCOUNTER — Ambulatory Visit
Admission: RE | Admit: 2013-03-22 | Discharge: 2013-03-22 | Disposition: A | Discharge status: Home / Self Care | Payer: BC Managed Care – PPO | Source: Ambulatory Visit | Attending: Radiation Oncology | Admitting: Radiation Oncology

## 2013-03-22 ENCOUNTER — Ambulatory Visit: Payer: BC Managed Care – PPO

## 2013-03-22 ENCOUNTER — Encounter: Payer: Self-pay | Admitting: Radiation Oncology

## 2013-03-22 VITALS — BP 118/69 | HR 79 | Resp 16 | Wt 167.5 lb

## 2013-03-22 DIAGNOSIS — C50111 Malignant neoplasm of central portion of right female breast: Secondary | ICD-10-CM

## 2013-03-22 NOTE — Progress Notes (Signed)
[facility]    Radiation Oncology [address] [place]     Maryln Gottron, M.D. [place], [place] [address]               Billie Lade, M.D., Ph.D. Phone: [phone]      Molli Hazard A. Kathrynn Running, M.D. Fax: [phone]      Radene Gunning, M.D., Ph.D.         Lurline Hare, M.D.         Grayland Jack, M.D Weekly Treatment Management Note  Name: Brittany Colon     MRN: [id number]        CSN: [id number] Date: 03/22/2013      DOB: 1961/07/19  CC: Thayer Headings, MD         Thea Silversmith    Status: Outpatient  Diagnosis: Right breast cancer  Current Dose: 46.8 Gy  Current Fraction: 27  Planned Dose: 62.4 Gy  Narrative: Brittany Colon was seen today for weekly treatment management. The chart was checked and port films  were reviewed. She is having some fatigue at this time. She is also noticed some itching and discomfort in the breast area but her Biafine is helping this issue.  Sulfur Current Outpatient Prescriptions  Medication Sig Dispense Refill  . emollient (BIAFINE) cream Apply topically 2 (two) times daily.      . non-metallic deodorant Thornton Papas) MISC Apply 1 application topically daily as needed.      . tamoxifen (NOLVADEX) 20 MG tablet Take 1 tablet (20 mg total) by mouth daily.  90 tablet  12   No current facility-administered medications for this encounter.      Physical Examination:  weight is 167 lb 8 oz (75.978 kg). Her blood pressure is 118/69 and her pulse is 79. Her respiration is 16.    Wt Readings from Last 3 Encounters:  03/22/13 167 lb 8 oz (75.978 kg)  03/15/13 169 lb 4.8 oz (76.794 kg)  03/14/13 170 lb 1.6 oz (77.157 kg)    The right breast area shows significant hyperpigmentation changes and some dry desquamation. There is no moist desquamation. Lungs - Normal respiratory effort, chest expands symmetrically. Lungs are clear to auscultation, no crackles or wheezes.  Heart has regular rhythm and rate  Abdomen is soft and non tender  with normal bowel sounds  Assessment:  Patient tolerating treatments well except for issues as above  Plan: Continue treatment per original radiation prescription

## 2013-03-22 NOTE — Progress Notes (Signed)
Tired red eyes noted. Patient reports fatigue. Patient reports dull headache x 1 week. No edema of right arm noted. Hyperpigmentation of right breast without desquamation noted. Reports using biafine as directed.

## 2013-03-23 ENCOUNTER — Ambulatory Visit: Payer: BC Managed Care – PPO

## 2013-03-23 ENCOUNTER — Ambulatory Visit
Admission: RE | Admit: 2013-03-23 | Discharge: 2013-03-23 | Disposition: A | Discharge status: Home / Self Care | Payer: BC Managed Care – PPO | Source: Ambulatory Visit | Attending: Radiation Oncology | Admitting: Radiation Oncology

## 2013-03-24 ENCOUNTER — Ambulatory Visit: Payer: BC Managed Care – PPO

## 2013-03-24 ENCOUNTER — Ambulatory Visit
Admission: RE | Admit: 2013-03-24 | Discharge: 2013-03-24 | Disposition: A | Discharge status: Home / Self Care | Payer: BC Managed Care – PPO | Source: Ambulatory Visit | Attending: Radiation Oncology | Admitting: Radiation Oncology

## 2013-03-25 ENCOUNTER — Ambulatory Visit
Admission: RE | Admit: 2013-03-25 | Discharge: 2013-03-25 | Disposition: A | Discharge status: Home / Self Care | Payer: BC Managed Care – PPO | Source: Ambulatory Visit | Attending: Radiation Oncology | Admitting: Radiation Oncology

## 2013-03-25 DIAGNOSIS — C50111 Malignant neoplasm of central portion of right female breast: Secondary | ICD-10-CM

## 2013-03-25 MED ORDER — BIAFINE EX EMUL
CUTANEOUS | Status: DC | PRN
  Administered 2013-03-25: 08:00:00 via TOPICAL

## 2013-03-28 ENCOUNTER — Ambulatory Visit
Admission: RE | Admit: 2013-03-28 | Discharge: 2013-03-28 | Disposition: A | Discharge status: Home / Self Care | Payer: BC Managed Care – PPO | Source: Ambulatory Visit | Attending: Radiation Oncology | Admitting: Radiation Oncology

## 2013-03-29 ENCOUNTER — Ambulatory Visit: Payer: BC Managed Care – PPO

## 2013-03-29 ENCOUNTER — Ambulatory Visit
Admission: RE | Admit: 2013-03-29 | Discharge: 2013-03-29 | Disposition: A | Discharge status: Home / Self Care | Payer: BC Managed Care – PPO | Source: Ambulatory Visit | Attending: Radiation Oncology | Admitting: Radiation Oncology

## 2013-03-29 ENCOUNTER — Encounter: Payer: Self-pay | Admitting: Radiation Oncology

## 2013-03-29 VITALS — BP 110/74 | HR 71 | Temp 98.4°F | Resp 20 | Wt 167.1 lb

## 2013-03-29 DIAGNOSIS — C50111 Malignant neoplasm of central portion of right female breast: Secondary | ICD-10-CM

## 2013-03-29 NOTE — Progress Notes (Signed)
Weekly Management Note Current Dose: 58.4  Gy  Projected Dose: 60.4 Gy   Narrative:  The patient presents for routine under treatment assessment.  CBCT/MVCT images/Port film x-rays were reviewed.  The chart was checked. Doing well. Ready to be done.  Feels skin is improving.   Physical Findings: Weight: 167 lb 1.6 oz (75.796 kg). Dry desquamation over right breast. No moist desquamation.   Impression:  The patient is tolerating radiation.  Plan:  Continue treatment as planned. Follow up in 1 month. Has script for tamoxifen and instructions.  Follow up with med onc in January.

## 2013-03-29 NOTE — Progress Notes (Signed)
Weekly rad txs, rt breast   32/33 completed, dark hyperpigmentation, one area on breasktskin broken, , using biafine creamm bid, C/o occasional shooting pains, appetite fair, started melatonin last night to help with sleep, ftigued  After rad txs when she gets home, 1 month f/u appt card Montez Hageman flyer given also, pt to read over 11:40 AM

## 2013-03-30 ENCOUNTER — Ambulatory Visit
Admission: RE | Admit: 2013-03-30 | Discharge: 2013-03-30 | Disposition: A | Discharge status: Home / Self Care | Payer: BC Managed Care – PPO | Source: Ambulatory Visit | Attending: Radiation Oncology | Admitting: Radiation Oncology

## 2013-03-30 ENCOUNTER — Ambulatory Visit: Payer: BC Managed Care – PPO

## 2013-03-31 ENCOUNTER — Ambulatory Visit
Admission: RE | Admit: 2013-03-31 | Discharge: 2013-03-31 | Disposition: A | Discharge status: Home / Self Care | Payer: BC Managed Care – PPO | Source: Ambulatory Visit | Attending: Radiation Oncology | Admitting: Radiation Oncology

## 2013-04-01 ENCOUNTER — Ambulatory Visit: Payer: BC Managed Care – PPO

## 2013-04-06 ENCOUNTER — Encounter: Payer: Self-pay | Admitting: Radiation Oncology

## 2013-04-06 NOTE — Progress Notes (Signed)
  Radiation Oncology         [phone] ________________________________  Name: Brittany Colon MRN: [id number]  Date: 04/06/2013  DOB: 1960/12/14  End of Treatment Note  Diagnosis:   Intraductal carcinoma of the right breast     Indication for treatment:  Breast conservation therapy       Radiation treatment dates:   June 18 through August 7  Site/dose:   Right breast 50.4 gray, the site of presentation within the breast area was boosted to 62.4 gray  Beams/energy:   Tangential beams encompassing the right breast, forward planning was used to improve the dose homogeneity, the lumpectomy cavity was boosted with a custom electron cutout using 12 MeV electrons  Narrative: The patient tolerated radiation treatment relatively well.   She had some mild fatigue as well as some mild discomfort and itching within the breast area. She did not experience any moist desquamation  Plan: The patient has completed radiation treatment. The patient will return to radiation oncology clinic for routine followup in one month. I advised them to call or return sooner if they have any questions or concerns related to their recovery or treatment.  -----------------------------------  Billie Lade, PhD, MD

## 2013-04-28 ENCOUNTER — Encounter: Payer: Self-pay | Admitting: Oncology

## 2013-05-02 ENCOUNTER — Ambulatory Visit
Admission: RE | Admit: 2013-05-02 | Discharge: 2013-05-02 | Disposition: A | Discharge status: Home / Self Care | Payer: BC Managed Care – PPO | Source: Ambulatory Visit | Attending: Radiation Oncology | Admitting: Radiation Oncology

## 2013-05-02 ENCOUNTER — Encounter: Payer: Self-pay | Admitting: Radiation Oncology

## 2013-05-02 VITALS — BP 117/73 | HR 88 | Temp 97.9°F | Ht 62.0 in | Wt 169.0 lb

## 2013-05-02 DIAGNOSIS — C50111 Malignant neoplasm of central portion of right female breast: Secondary | ICD-10-CM

## 2013-05-02 NOTE — Progress Notes (Signed)
  Radiation Oncology         [phone] ________________________________  Name: Brittany Colon MRN: [id number]  Date: 05/02/2013  DOB: September 27, 1960  Follow-Up Visit Note  CC: Thayer Headings, MD  Mariella Saa, MD  Diagnosis:   Intraductal carcinoma of the right breast  Interval Since Last Radiation:  1  months  Narrative:  The patient returns today for routine follow-up.  She is doing reasonably well at this time.  She has started back working and is tolerating this well. She continues to have some discomfort in the right breast but is not requiring any pain medication for this issue. She continues to have problems with sleeping well. She has started melatonin which is somewhat helpful.  She has had some mild itching in the nipple areolar area.                              ALLERGIES:  is allergic to sulfur.  Meds: Current Outpatient Prescriptions  Medication Sig Dispense Refill  . Melaton-Thean-Cham-PassF-LBalm (MELATONIN + L-THEANINE PO) Take 1 capsule by mouth at bedtime as needed.      . tamoxifen (NOLVADEX) 20 MG tablet Take 1 tablet (20 mg total) by mouth daily.  90 tablet  12  . emollient (BIAFINE) cream Apply topically 2 (two) times daily.      . non-metallic deodorant Thornton Papas) MISC Apply 1 application topically daily as needed.       No current facility-administered medications for this encounter.    Physical Findings: The patient is in no acute distress. Patient is alert and oriented.  height is 5\' 2"  (1.575 m) and weight is 169 lb (76.658 kg). Her temperature is 97.9 F (36.6 C). Her blood pressure is 117/73 and her pulse is 88. Marland Kitchen No palpable supraclavicular or axillary adenopathy. The lungs are clear to auscultation. The heart has a regular rhythm and rate.  Examination of the left breast reveals fibrocystic changes. There is no dominant mass appreciated in the breast. Examination right breast reveals some mild hyperpigmentation changes. The patient's skin is well  healed at this time. There is no dominant mass appreciated in the breast nipple discharge or bleeding.  Lab Findings: Lab Results  Component Value Date   WBC 5.4 12/22/2012   HGB 12.8 01/10/2013   HCT 37.8 12/22/2012   MCV 89.1 12/22/2012   PLT 241 12/22/2012      Radiographic Findings: No results found.  Impression:  The patient is recovering from the effects of radiation.  No evidence of recurrence on clinical exam today  Plan:  When necessary followup in radiation oncology. Patient would continue close followup in medical oncology.  _____________________________________  -----------------------------------  Billie Lade, PhD, MD

## 2013-05-02 NOTE — Progress Notes (Signed)
Brittany Colon here for follow up after treatment to her right breast.  She is having pain around her right nipple area and in her incision area that she is rating a 6/10.  She also has occasional sharp pains in her right breast.  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  She reports that her appetite is not very good.  She reports that she has lost 6 lbs.  The skin on her right breast is intact with hyperpigmentation.  She started taking tamoxifen two weeks ago.

## 2013-05-10 ENCOUNTER — Telehealth: Payer: Self-pay | Admitting: Dietician

## 2013-06-27 ENCOUNTER — Telehealth (INDEPENDENT_AMBULATORY_CARE_PROVIDER_SITE_OTHER): Payer: Self-pay

## 2013-06-27 NOTE — Telephone Encounter (Signed)
Called patient and left message to call our office for her follow up appointment:  08/11/13 @ 9:30 am w/Dr. Johna Sheriff.

## 2013-06-27 NOTE — Telephone Encounter (Signed)
Message copied by Maryan Puls on Mon Jun 27, 2013 11:25 AM ------      Message from: Louie Casa      Created: Wed Jun 22, 2013 10:32 AM      Regarding: Dr. Johna Sheriff December appt      Contact: [phone]       Patient called to schedule LTFU/6 mos breast recheck in December, please could you make the appointment.            Thank you. ------

## 2013-08-05 ENCOUNTER — Telehealth (INDEPENDENT_AMBULATORY_CARE_PROVIDER_SITE_OTHER): Payer: Self-pay

## 2013-08-05 NOTE — Telephone Encounter (Signed)
Called and left message for patient regarding upcoming appointment.  Need to r/s patient's appointment on 08/11/13 @ 9:30am to 09/09/13 @ 4:30pm if possible.

## 2013-08-11 ENCOUNTER — Ambulatory Visit (INDEPENDENT_AMBULATORY_CARE_PROVIDER_SITE_OTHER): Payer: BC Managed Care – PPO | Admitting: General Surgery

## 2013-09-09 ENCOUNTER — Encounter (INDEPENDENT_AMBULATORY_CARE_PROVIDER_SITE_OTHER): Payer: Self-pay | Admitting: General Surgery

## 2013-09-09 ENCOUNTER — Ambulatory Visit (INDEPENDENT_AMBULATORY_CARE_PROVIDER_SITE_OTHER): Payer: BC Managed Care – PPO | Admitting: General Surgery

## 2013-09-09 ENCOUNTER — Encounter (INDEPENDENT_AMBULATORY_CARE_PROVIDER_SITE_OTHER): Payer: Self-pay

## 2013-09-09 VITALS — BP 122/80 | HR 72 | Temp 97.6°F | Resp 14 | Ht 62.0 in | Wt 171.0 lb

## 2013-09-09 DIAGNOSIS — C50119 Malignant neoplasm of central portion of unspecified female breast: Secondary | ICD-10-CM

## 2013-09-09 NOTE — Progress Notes (Signed)
Chief complaint: Followup DCIS right breast  History: Patient returns for long-term followup with a history of right breast lumpectomy and subsequent radiation for a small, 0.4 cm, area of low-grade ductal carcinoma in situ of the right breast. She is now also on tamoxifen for chemoprevention. Surgery date was 01/10/2013. She reports she is doing well. Some mild soreness of the arm. Tolerating tamoxifen with just minimal tolerable hot flashes.  Exam: BP 122/80  Pulse 72  Temp(Src) 97.6 F (36.4 C) (Temporal)  Resp 14  Ht 5\' 2"  (1.575 m)  Wt 171 lb (77.565 kg)  BMI 31.27 kg/m2 General: Appears well Breasts: Moderate postradiation changes right breast. There is some nodularity in the lateral aspect of the breast which is likely chronic. Mild thickening at the polypectomy site upper outer quadrant. No palpable adenopathy. Left breast negative to exam.  Assessment and plan: Doing well following treatment for DCIS as above. Return in 6 months

## 2013-09-21 ENCOUNTER — Other Ambulatory Visit: Payer: Self-pay | Admitting: Obstetrics and Gynecology

## 2013-09-21 DIAGNOSIS — N6310 Unspecified lump in the right breast, unspecified quadrant: Secondary | ICD-10-CM

## 2013-09-21 DIAGNOSIS — N6315 Unspecified lump in the right breast, overlapping quadrants: Secondary | ICD-10-CM

## 2013-09-21 DIAGNOSIS — Z853 Personal history of malignant neoplasm of breast: Secondary | ICD-10-CM

## 2013-09-21 DIAGNOSIS — N6321 Unspecified lump in the left breast, upper outer quadrant: Secondary | ICD-10-CM

## 2013-09-27 ENCOUNTER — Ambulatory Visit
Admission: RE | Admit: 2013-09-27 | Discharge: 2013-09-27 | Disposition: A | Discharge status: Home / Self Care | Payer: BC Managed Care – PPO | Source: Ambulatory Visit | Attending: Obstetrics and Gynecology | Admitting: Obstetrics and Gynecology

## 2013-09-27 DIAGNOSIS — N6321 Unspecified lump in the left breast, upper outer quadrant: Secondary | ICD-10-CM

## 2013-09-27 DIAGNOSIS — N6310 Unspecified lump in the right breast, unspecified quadrant: Secondary | ICD-10-CM

## 2013-09-27 DIAGNOSIS — Z853 Personal history of malignant neoplasm of breast: Secondary | ICD-10-CM

## 2013-09-27 DIAGNOSIS — N6315 Unspecified lump in the right breast, overlapping quadrants: Secondary | ICD-10-CM

## 2014-03-22 ENCOUNTER — Other Ambulatory Visit: Payer: Self-pay | Admitting: Oncology

## 2014-03-22 ENCOUNTER — Other Ambulatory Visit: Payer: Self-pay | Admitting: *Deleted

## 2014-03-22 DIAGNOSIS — C50111 Malignant neoplasm of central portion of right female breast: Secondary | ICD-10-CM

## 2014-03-25 ENCOUNTER — Telehealth: Payer: Self-pay | Admitting: Oncology

## 2014-03-25 NOTE — Telephone Encounter (Signed)
s.w. pt and advised on SEpt appt...pt ok and aware °

## 2014-04-07 ENCOUNTER — Encounter (INDEPENDENT_AMBULATORY_CARE_PROVIDER_SITE_OTHER): Payer: Self-pay | Admitting: General Surgery

## 2014-04-07 ENCOUNTER — Ambulatory Visit (INDEPENDENT_AMBULATORY_CARE_PROVIDER_SITE_OTHER): Payer: BC Managed Care – PPO | Admitting: General Surgery

## 2014-04-07 VITALS — BP 126/64 | HR 76 | Temp 98.0°F | Resp 18 | Ht 64.0 in | Wt 164.0 lb

## 2014-04-07 DIAGNOSIS — C50119 Malignant neoplasm of central portion of unspecified female breast: Secondary | ICD-10-CM

## 2014-04-07 DIAGNOSIS — C50111 Malignant neoplasm of central portion of right female breast: Secondary | ICD-10-CM

## 2014-04-07 NOTE — Progress Notes (Signed)
Chief complaint: Followup DCIS right breast  History: Patient returns for long-term followup with a history of right breast lumpectomy and subsequent radiation for a small, 0.4 cm, area of low-grade ductal carcinoma in situ of the right breast. She is now also on tamoxifen for chemoprevention. Surgery date was 01/10/2013. She reports she is doing well. Some mild soreness of the Breast. Tolerating tamoxifen with just minimal tolerable hot flashes.  Exam: BP 126/64  Pulse 76  Temp(Src) 98 F (36.7 C)  Resp 18  Ht 5\' 4"  (1.626 m)  Wt 164 lb (74.39 kg)  BMI 28.14 kg/m2 General: Appears well Breasts: Moderate postradiation changes right breast. . Mild thickening at the Lumpectomy site upper outer quadrant. No palpable adenopathy. Left breast negative to exam.  Imaging: Mammogram February 2015 was negative with post lumpectomy changes  Assessment and plan: Doing well following treatment for DCIS as above. Return in 1 year

## 2014-05-16 ENCOUNTER — Ambulatory Visit (HOSPITAL_BASED_OUTPATIENT_CLINIC_OR_DEPARTMENT_OTHER): Payer: BC Managed Care – PPO | Admitting: Oncology

## 2014-05-16 ENCOUNTER — Other Ambulatory Visit: Payer: BC Managed Care – PPO

## 2014-05-16 ENCOUNTER — Telehealth: Payer: Self-pay | Admitting: Oncology

## 2014-05-16 VITALS — BP 120/66 | HR 76 | Temp 98.3°F | Resp 18 | Ht 64.0 in | Wt 170.0 lb

## 2014-05-16 DIAGNOSIS — C50119 Malignant neoplasm of central portion of unspecified female breast: Secondary | ICD-10-CM

## 2014-05-16 DIAGNOSIS — Z853 Personal history of malignant neoplasm of breast: Secondary | ICD-10-CM

## 2014-05-16 DIAGNOSIS — Z79810 Long term (current) use of selective estrogen receptor modulators (SERMs): Secondary | ICD-10-CM

## 2014-05-16 NOTE — Progress Notes (Signed)
ID: Brittany Colon   DOB: [birth year]  MR#: [id number]  OVZ#:[id number]  PCP: Brittany Sheller, MD GYN: Brittany Colon SU: Brittany Colon OTHER MD: Brittany Colon  CHIEF COMPLAINT: Ductal carcinoma in situ  CURRENT TREATMENT: Tamoxifen  HISTORY OF PRESENT ILLNESS: From the original intake note:  "Brittany Colon" has lumpy breasts, and has had diagnostic mammography previously. Screening mammography 08/14/2011 suggested a possible change in the right breast, and diagnostic right breast mammography 09/09/2011 found a cluster of coarse calcifications superiorly in the right breast. These were felt to be likely benign, and six-month followup was recommended. However the next mammogram was 12/14/2012. This showed the calcifications in question to have increased since the prior study. The covered an area of 7 mm. 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  Bilateral breast MRI 12/18/2012 showed only post biopsy change and multiple breast cysts bilaterally. The patient's subsequent history is as detailed below  INTERVAL HISTORY: Brittany Colon returns for followup of her noninvasive breast cancer. She has been on tamoxifen since September 2014. She is generally tolerating that well, her only side effect being hot flashes. These are worse at night than during the day. They do wake her up.  REVIEW OF SYSTEMS: She she is having some leg cramps, which also occur at night. This may or may not be related to the tamoxifen. I don't seem to be related to exercise. She doesn't walking regularly. She has rare headaches. She has some sinus symptoms. She has some arthralgias which are not more persistent or intense than before. A detailed review of systems was otherwise noncontributory  PAST MEDICAL HISTORY: Past Medical History  Diagnosis Date  . Headache(784.0)   . Breast cancer 12/14/12    right  . Breast cyst 12/18/12   mult bilaterally per MRI  . Allergy   . History of radiation therapy 02/09/2013-03/31/2013    62.4 gray to the right breast    PAST SURGICAL HISTORY: Past Surgical History  Procedure Laterality Date  . Carpal tunnel release Right   . Foot surgery  1993    bone spur lt foot  . Breast lumpectomy with needle localization Right 01/10/2013    Procedure: BREAST LUMPECTOMY WITH NEEDLE LOCALIZATION;  Surgeon: Brittany Jolly, MD;  Location: [facility];  Service: General;  Laterality: Right;    FAMILY HISTORY Family History  Problem Relation Age of Onset  . Heart disease Mother    The patient's father died from Parkinson's disease at age 71. The patient's mother died from a myocardial infarction at age 30. Lives has 3 brothers, one sister. There is no history of cancer in the family to her knowledge.   GYNECOLOGIC HISTORY: Menarche age 48, first live birth age 76. She is GX P2. She still having regular periods. She never took birth control pills.   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 Her husband Brittany Colon is disabled secondary to sleep apnea and fibromyalgia. Daughter Brittany Colon works as a Psychologist, sport and exercise at [employer] son Brittany Colon is Scientist, physiological at [employer]. The patient has 1 grandchild. She attends a [employer] in [place] DIRECTIVES:Not in place   HEALTH MAINTENANCE: History  Substance Use Topics  . Smoking status: Never Smoker   . Smokeless tobacco: Not on file  . Alcohol Use: No     Colonoscopy:  PAP:  Bone density:  Lipid panel:  Allergies  Allergen Reactions  . Sulfur     vomiting    Current Outpatient Prescriptions  Medication Sig Dispense Refill  . Melaton-Thean-Cham-PassF-LBalm (MELATONIN + L-THEANINE PO) Take 1 capsule by mouth at bedtime as needed.      . tamoxifen (NOLVADEX) 20 MG tablet TAKE 1 TABLET (20 MG  TOTAL) BY MOUTH DAILY.  90 tablet  1   No current facility-administered medications for this visit.    OBJECTIVE: Middle-aged [name] who appears younger than stated age 75 Vitals:   05/16/14 1139  BP: 120/66  Pulse: 76  Temp: 98.3 F (36.8 C)  Resp: 18     Body mass index is 29.17 kg/(m^2).    ECOG FS: 0  Sclerae unicteric, pupils round and equal Oropharynx clear and moist No cervical or supraclavicular adenopathy Lungs no rales or rhonchi Heart regular rate and rhythm Abd soft, nontender, positive bowel sounds MSK no focal spinal tenderness, no upper extremity lymphedema Neuro: nonfocal, well oriented, positive affect  Breasts: The right breast is status post radiation and surgery. There is no evidence of local recurrence. The right axilla is benign. The left breast is unremarkable.  LAB RESULTS: Lab Results  Component Value Date   WBC 5.4 12/22/2012   NEUTROABS 2.5 12/22/2012   HGB 12.8 01/10/2013   HCT 37.8 12/22/2012   MCV 89.1 12/22/2012   PLT 241 12/22/2012      Chemistry      Component Value Date/Time   NA 141 12/22/2012 1219   K 4.1 12/22/2012 1219   CL 107 12/22/2012 1219   CO2 28 12/22/2012 1219   BUN 14.3 12/22/2012 1219   CREATININE 0.8 12/22/2012 1219      Component Value Date/Time   CALCIUM 9.2 12/22/2012 1219   ALKPHOS 61 12/22/2012 1219   AST 16 12/22/2012 1219   ALT 11 12/22/2012 1219   BILITOT 0.28 12/22/2012 1219       No results found for this basename: LABCA2    No components found with this basename: LABCA125    No results found for this basename: INR,  in the last 168 hours  Urinalysis    Component Value Date/Time   LABSPEC 1.020 11/21/2008 1036   PHURINE 6.5 11/21/2008 1036   GLUCOSEU NEGATIVE 11/21/2008 1036   HGBUR NEGATIVE 11/21/2008 1036   BILIRUBINUR NEGATIVE 11/21/2008 1036   KETONESUR NEGATIVE 11/21/2008 1036   PROTEINUR NEGATIVE 11/21/2008 1036   UROBILINOGEN 0.2 11/21/2008 1036   NITRITE NEGATIVE 11/21/2008 1036    LEUKOCYTESUR NEGATIVE Biochemical Testing Only. Please order routine urinalysis from main lab if confirmatory testing is needed. 11/21/2008 1036    STUDIES: Mammography to 11/11/2013 was unremarkable   ASSESSMENT: 53 y.o. [place] woman status post right breast upper outer quadrant lumpectomy 01/10/2013 for a high-grade ductal carcinoma in situ, 100% estrogen receptor positive, 100% progesterone receptor positive.  (1) adjuvant radiation  completed 03/31/2013  (2) tamoxifen started Sept 2014    PLAN: Brittany Colon is tolerating the tamoxifen well except for the hot flashes. These are worse at night. We are going to try gabapentin at bedtime. She has a good understanding of the possible toxicities, side effects and complications. I also suggested she consider getting her medications at [facility], where she is a ready a member. I think she will get a significant discount.  Otherwise the plan will be to continue tamoxifen for total of 4 more years. She will see me again a year  from now. We are not doing lab work at these visits, leaving that up to her primary care physician.  I encouraged her in her excellent exercise and diet.  She knows to call for any problems that may develop before her next visit here.   Tisa Weisel C    05/16/2014

## 2014-05-16 NOTE — Telephone Encounter (Signed)
per pof to sch pt appt-left pt message-will mail copy to pt

## 2014-06-14 LAB — HM COLONOSCOPY

## 2014-09-07 ENCOUNTER — Other Ambulatory Visit: Payer: Self-pay | Admitting: Oncology

## 2014-09-07 DIAGNOSIS — Z853 Personal history of malignant neoplasm of breast: Secondary | ICD-10-CM

## 2014-09-07 DIAGNOSIS — Z9889 Other specified postprocedural states: Secondary | ICD-10-CM

## 2014-09-19 ENCOUNTER — Other Ambulatory Visit: Payer: Self-pay | Admitting: Oncology

## 2014-09-19 DIAGNOSIS — C50111 Malignant neoplasm of central portion of right female breast: Secondary | ICD-10-CM

## 2014-09-22 ENCOUNTER — Other Ambulatory Visit: Payer: Self-pay | Admitting: Oncology

## 2014-09-28 ENCOUNTER — Other Ambulatory Visit: Payer: Self-pay | Admitting: Obstetrics and Gynecology

## 2014-09-28 ENCOUNTER — Ambulatory Visit
Admission: RE | Admit: 2014-09-28 | Discharge: 2014-09-28 | Disposition: A | Discharge status: Home / Self Care | Payer: BC Managed Care – PPO | Source: Ambulatory Visit | Attending: Oncology | Admitting: Oncology

## 2014-09-28 DIAGNOSIS — Z9889 Other specified postprocedural states: Secondary | ICD-10-CM

## 2014-09-28 DIAGNOSIS — Z853 Personal history of malignant neoplasm of breast: Secondary | ICD-10-CM

## 2014-09-29 LAB — CYTOLOGY - PAP

## 2014-11-10 ENCOUNTER — Telehealth: Payer: Self-pay | Admitting: Oncology

## 2014-11-10 NOTE — Telephone Encounter (Signed)
Lvm advising appt chg from 9/26 md pal to 10/10 @ 11am. Also mailed appt.

## 2015-05-20 ENCOUNTER — Encounter (HOSPITAL_COMMUNITY): Payer: Self-pay

## 2015-05-20 ENCOUNTER — Emergency Department (HOSPITAL_COMMUNITY): Payer: BC Managed Care – PPO

## 2015-05-20 ENCOUNTER — Emergency Department (HOSPITAL_COMMUNITY)
Admission: EM | Admit: 2015-05-20 | Discharge: 2015-05-20 | Disposition: A | Discharge status: Home / Self Care | Payer: BC Managed Care – PPO | Attending: Emergency Medicine | Admitting: Emergency Medicine

## 2015-05-20 DIAGNOSIS — Z853 Personal history of malignant neoplasm of breast: Secondary | ICD-10-CM | POA: Diagnosis not present

## 2015-05-20 DIAGNOSIS — G43109 Migraine with aura, not intractable, without status migrainosus: Secondary | ICD-10-CM | POA: Diagnosis not present

## 2015-05-20 DIAGNOSIS — R200 Anesthesia of skin: Secondary | ICD-10-CM | POA: Diagnosis present

## 2015-05-20 DIAGNOSIS — Z79899 Other long term (current) drug therapy: Secondary | ICD-10-CM | POA: Insufficient documentation

## 2015-05-20 LAB — COMPREHENSIVE METABOLIC PANEL
ALK PHOS: 41 U/L (ref 38–126)
ALT: 14 U/L (ref 14–54)
ANION GAP: 8 (ref 5–15)
AST: 22 U/L (ref 15–41)
Albumin: 3.5 g/dL (ref 3.5–5.0)
BILIRUBIN TOTAL: 0.5 mg/dL (ref 0.3–1.2)
BUN: 10 mg/dL (ref 6–20)
CALCIUM: 8.8 mg/dL — AB (ref 8.9–10.3)
CO2: 24 mmol/L (ref 22–32)
Chloride: 104 mmol/L (ref 101–111)
Creatinine, Ser: 0.68 mg/dL (ref 0.44–1.00)
GFR calc Af Amer: >60 mL/min (ref >60)
Glucose, Bld: 80 mg/dL (ref 65–99)
POTASSIUM: 4.1 mmol/L (ref 3.5–5.1)
Sodium: 136 mmol/L (ref 135–145)
TOTAL PROTEIN: 6.7 g/dL (ref 6.5–8.1)

## 2015-05-20 LAB — I-STAT CHEM 8, ED
BUN: 12 mg/dL (ref 6–20)
CALCIUM ION: 1.14 mmol/L (ref 1.12–1.23)
CREATININE: 0.7 mg/dL (ref 0.44–1.00)
Chloride: 105 mmol/L (ref 101–111)
Glucose, Bld: 81 mg/dL (ref 65–99)
HEMATOCRIT: 39 % (ref 36.0–46.0)
HEMOGLOBIN: 13.3 g/dL (ref 12.0–15.0)
Potassium: 4.1 mmol/L (ref 3.5–5.1)
Sodium: 140 mmol/L (ref 135–145)
TCO2: 24 mmol/L (ref 0–100)

## 2015-05-20 LAB — DIFFERENTIAL
Basophils Absolute: 0 10*3/uL (ref 0.0–0.1)
Basophils Relative: 0 %
EOS ABS: 0.1 10*3/uL (ref 0.0–0.7)
EOS PCT: 2 %
LYMPHS ABS: 2.7 10*3/uL (ref 0.7–4.0)
Lymphocytes Relative: 47 %
MONOS PCT: 6 %
Monocytes Absolute: 0.4 10*3/uL (ref 0.1–1.0)
Neutro Abs: 2.5 10*3/uL (ref 1.7–7.7)
Neutrophils Relative %: 45 %

## 2015-05-20 LAB — PROTIME-INR
INR: 1.07 (ref 0.00–1.49)
Prothrombin Time: 14.1 seconds (ref 11.6–15.2)

## 2015-05-20 LAB — CBC
HEMATOCRIT: 38.2 % (ref 36.0–46.0)
HEMOGLOBIN: 12.2 g/dL (ref 12.0–15.0)
MCH: 29.1 pg (ref 26.0–34.0)
MCHC: 31.9 g/dL (ref 30.0–36.0)
MCV: 91.2 fL (ref 78.0–100.0)
Platelets: 186 10*3/uL (ref 150–400)
RBC: 4.19 MIL/uL (ref 3.87–5.11)
RDW: 12.8 % (ref 11.5–15.5)
WBC: 5.6 10*3/uL (ref 4.0–10.5)

## 2015-05-20 LAB — APTT: aPTT: 30 seconds (ref 24–37)

## 2015-05-20 LAB — I-STAT TROPONIN, ED: TROPONIN I, POC: 0 ng/mL (ref 0.00–0.08)

## 2015-05-20 MED ORDER — LIDOCAINE VISCOUS 2 % MT SOLN: 15.0000 mL | Freq: Once | OROMUCOSAL | Status: DC

## 2015-05-20 NOTE — ED Notes (Signed)
Patient transported to MRI 

## 2015-05-20 NOTE — ED Notes (Signed)
PA at bedside.

## 2015-05-20 NOTE — ED Notes (Signed)
Pt reports she was at church this morning and she began to have right side facial/lip numbess onset today at 1300. Pt endorses HA, but hx of headaches and states this HA is usual for her. Strong equal hand grasps, no facial droop, no weakness. Dr. Alfonse Spruce at bedside to evaluate patient. No code stroke activated at this time.

## 2015-05-20 NOTE — Discharge Instructions (Signed)
Your evaluated in the ED today for your numbness. There does not appear to be an emergent cause for your symptoms at this time. Your exam, labs and imaging of your head are all reassuring. It is important for you to Follow-up with neurology for further evaluation and management of your symptoms. Return to ED for new or worsening symptoms.  Migraine Headache A migraine headache is an intense, throbbing pain on one or both sides of your head. A migraine can last for 30 minutes to several hours. CAUSES  The exact cause of a migraine headache is not always known. However, a migraine may be caused when nerves in the brain become irritated and release chemicals that cause inflammation. This causes pain. Certain things may also trigger migraines, such as:  Alcohol.  Smoking.  Stress.  Menstruation.  Aged cheeses.  Foods or drinks that contain nitrates, glutamate, aspartame, or tyramine.  Lack of sleep.  Chocolate.  Caffeine.  Hunger.  Physical exertion.  Fatigue.  Medicines used to treat chest pain (nitroglycerine), birth control pills, estrogen, and some blood pressure medicines. SIGNS AND SYMPTOMS  Pain on one or both sides of your head.  Pulsating or throbbing pain.  Severe pain that prevents daily activities.  Pain that is aggravated by any physical activity.  Nausea, vomiting, or both.  Dizziness.  Pain with exposure to bright lights, loud noises, or activity.  General sensitivity to bright lights, loud noises, or smells. Before you get a migraine, you may get warning signs that a migraine is coming (aura). An aura may include:  Seeing flashing lights.  Seeing bright spots, halos, or zigzag lines.  Having tunnel vision or blurred vision.  Having feelings of numbness or tingling.  Having trouble talking.  Having muscle weakness. DIAGNOSIS  A migraine headache is often diagnosed based on:  Symptoms.  Physical exam.  A CT scan or MRI of your head.  These imaging tests cannot diagnose migraines, but they can help rule out other causes of headaches. TREATMENT Medicines may be given for pain and nausea. Medicines can also be given to help prevent recurrent migraines.  HOME CARE INSTRUCTIONS  Only take over-the-counter or prescription medicines for pain or discomfort as directed by your health care provider. The use of long-term narcotics is not recommended.  Lie down in a dark, quiet room when you have a migraine.  Keep a journal to find out what may trigger your migraine headaches. For example, write down:  What you eat and drink.  How much sleep you get.  Any change to your diet or medicines.  Limit alcohol consumption.  Quit smoking if you smoke.  Get 7-9 hours of sleep, or as recommended by your health care provider.  Limit stress.  Keep lights dim if bright lights bother you and make your migraines worse. SEEK IMMEDIATE MEDICAL CARE IF:   Your migraine becomes severe.  You have a fever.  You have a stiff neck.  You have vision loss.  You have muscular weakness or loss of muscle control.  You start losing your balance or have trouble walking.  You feel faint or pass out.  You have severe symptoms that are different from your first symptoms. MAKE SURE YOU:   Understand these instructions.  Will watch your condition.  Will get help right away if you are not doing well or get worse. Document Released: 08/11/2005 Document Revised: 12/26/2013 Document Reviewed: 04/18/2013 [facility] Patient Information 2015 [place], [place]. This information is not intended to replace advice  given to you by your health care provider. Make sure you discuss any questions you have with your health care provider.

## 2015-05-20 NOTE — ED Provider Notes (Signed)
CSN: [id number]     Arrival date & time 05/20/15  1641 History   First MD Initiated Contact with Patient 05/20/15 1656     Chief Complaint  Patient presents with  . Numbness     (Consider location/radiation/quality/duration/timing/severity/associated sxs/prior Treatment) HPI Brittany Colon is a 54 y.o. female with a reported history of migraines, who comes in for evaluation of numbness. Patient states at approximately 1:00 PM while she was standing in church she showed it to have right-sided lower lip numbness and a sharp pain in her right forearm. She denies any headache, vision changes, chest pain, shortness of breath, other numbness or weakness. Now in the ED she reports the numbness has spread to her right cheek. Denies any difficulties walking. No other aggravating or modifying factors. Rates her numbness as an 8/10.  Past Medical History  Diagnosis Date  . Headache(784.0)   . Breast cancer 12/14/12    right  . Breast cyst 12/18/12    mult bilaterally per MRI  . Allergy   . History of radiation therapy 02/09/2013-03/31/2013    62.4 gray to the right breast   Past Surgical History  Procedure Laterality Date  . Carpal tunnel release Right   . Foot surgery  1993    bone spur lt foot  . Breast lumpectomy with needle localization Right 01/10/2013    Procedure: BREAST LUMPECTOMY WITH NEEDLE LOCALIZATION;  Surgeon: Edward Jolly, MD;  Location: [facility];  Service: General;  Laterality: Right;   Family History  Problem Relation Age of Onset  . Heart disease Mother    Social History  Substance Use Topics  . Smoking status: Never Smoker   . Smokeless tobacco: None  . Alcohol Use: No   OB History    No data available     Review of Systems A 10 point review of systems was completed and was negative except for pertinent positives and negatives as mentioned in the history of present illness     Allergies  Sulfur  Home Medications   Prior to  Admission medications   Medication Sig Start Date End Date Taking? Authorizing Provider  Glucosamine HCl (GLUCOSAMINE PO) Take 1 tablet by mouth daily.    Historical Provider, MD  Melaton-Thean-Cham-PassF-LBalm (MELATONIN + L-THEANINE PO) Take 1 capsule by mouth at bedtime as needed.    Historical Provider, MD  Multiple Vitamins-Minerals (MULTIVITAMIN PO) Take by mouth daily.    Historical Provider, MD  tamoxifen (NOLVADEX) 20 MG tablet TAKE 1 TABLET (20 MG TOTAL) BY MOUTH DAILY. 09/19/14   Chauncey Cruel, MD   BP 99/83 mmHg  Pulse 86  Temp(Src) 97.8 F (36.6 C) (Oral)  Resp 17  Ht 5\' 2"  (1.575 m)  Wt 172 lb (78.019 kg)  BMI 31.45 kg/m2  SpO2 99% Physical Exam  Constitutional: She is oriented to person, place, and time. She appears well-developed and well-nourished.  HENT:  Head: Normocephalic and atraumatic.  Mouth/Throat: Oropharynx is clear and moist.  Eyes: Conjunctivae are normal. Pupils are equal, round, and reactive to light. Right eye exhibits no discharge. Left eye exhibits no discharge. No scleral icterus.  Neck: Neck supple.  Cardiovascular: Normal rate, regular rhythm and normal heart sounds.   Pulmonary/Chest: Effort normal and breath sounds normal. No respiratory distress. She has no wheezes. She has no rales.  Abdominal: Soft. There is no tenderness.  Musculoskeletal: She exhibits no tenderness.  Neurological: She is alert and oriented to person, place, and time.  Cranial Nerves  II-XII grossly intact, paresthesias throughout right cheek and lower right lip. Motor strength is 5/5 in all 4 extremities. Sensation is intact to light touch. Extraocular movements intact without nystagmus. Completes finger to nose coordination movements without difficulty. No pronator drift. Gait is baseline  Skin: Skin is warm and dry. No rash noted.  Psychiatric: She has a normal mood and affect.  Nursing note and vitals reviewed.   ED Course  Procedures (including critical care  time) Labs Review Labs Reviewed  COMPREHENSIVE METABOLIC PANEL - Abnormal; Notable for the following:    Calcium 8.8 (*)    All other components within normal limits  PROTIME-INR  APTT  CBC  DIFFERENTIAL  I-STAT TROPOININ, ED  I-STAT CHEM 8, ED    Imaging Review Ct Head Wo Contrast  05/20/2015   CLINICAL DATA:  Right-sided facial numbness.  Right arm pain.  EXAM: CT HEAD WITHOUT CONTRAST  TECHNIQUE: Contiguous axial images were obtained from the base of the skull through the vertex without intravenous contrast.  COMPARISON:  None.  FINDINGS: No intracranial hemorrhage, mass effect, or midline shift. No hydrocephalus. The basilar cisterns are patent. No evidence of territorial infarct. No intracranial fluid collection. Calvarium is intact. Included paranasal sinuses and mastoid air cells are well aerated.  IMPRESSION: No acute intracranial abnormality.   Electronically Signed   By: Jeb Levering M.D.   On: 05/20/2015 18:06   Mr Brain Wo Contrast  05/20/2015   CLINICAL DATA:  Right-sided facial/ lip numbness beginning earlier today. Chronic headaches.  EXAM: MRI HEAD WITHOUT CONTRAST  TECHNIQUE: Multiplanar, multiecho pulse sequences of the brain and surrounding structures were obtained without intravenous contrast.  COMPARISON:  Head CT 05/20/2015  FINDINGS: There is no evidence of acute infarct, intracranial hemorrhage, mass, midline shift, or extra-axial fluid collection. Ventricles and sulci are normal. Small foci of T2 hyperintensity are present in the subcortical and deep cerebral white matter bilaterally, greatest in the periatrial white matter.  Orbits are unremarkable. Paranasal sinuses and mastoid air cells are clear. Major intracranial vascular flow voids are preserved.  IMPRESSION: 1. No acute intracranial abnormality. 2. Mild cerebral white matter disease, nonspecific. Considerations include chronic small vessel ischemia, sequelae of trauma, hypercoagulable state, vasculitis,  migraines, prior infection or demyelination.   Electronically Signed   By: Logan Bores M.D.   On: 05/20/2015 19:50   I have personally reviewed and evaluated these images and lab results as part of my medical decision-making.   EKG Interpretation   Date/Time:  Sunday May 20 2015 17:20:08 EDT Ventricular Rate:  75 PR Interval:  163 QRS Duration: 76 QT Interval:  389 QTC Calculation: 434 R Axis:   73 Text Interpretation:  Sinus rhythm Consider left ventricular hypertrophy  Anterior Q waves, possibly due to LVH No old tracing to compare Confirmed  by [facility]  MD, ELLIOTT [phone]) on 05/20/2015 7:22:31 PM     Meds given in ED:  Medications - No data to display  Discharge Medication List as of 05/20/2015  8:34 PM     Filed Vitals:   05/20/15 2000 05/20/15 2015 05/20/15 2038 05/20/15 2039  BP: 110/64 115/61 99/83 99/83   Pulse: 79 73 71 86  Temp:    97.8 F (36.6 C)  TempSrc:    Oral  Resp: 17 17    Height:      Weight:      SpO2: 100% 99% 99% 99%    MDM  Vitals stable - WNL -afebrile Pt resting comfortably in ED. PE--physical  exam as above. Normal neurological exam. Gait is baseline. No focal neurodeficits. Labwork-labs are baseline and noncontributory. Imaging--CT head shows no acute intracranial abnormality. Subsequent MRI obtained due to continued paresthesias. MR brain shows no acute intracranial abnormality. There is mild cerebral white matter disease possibly due to migraines.  At this time, patient appears well, is baseline per daughter in the room. She is stable to follow up outpatient with neurology. I was immediately available for any questions and patient voiced that she had none and was ready to go home. Low suspicion for any acute or emergent process at this time. Patient has had headache in the ED that she rates as a 7/10, but refuses treatment. Symptoms likely secondary to follow a migraine.  I discussed all relevant lab findings and imaging results with pt  and they verbalized understanding. Discussed f/u with PCP within 48 hrs and return precautions, pt very amenable to plan.  Final diagnoses:  Complicated migraine       Comer Locket, PA-C 05/21/15 [place], MD 05/22/15 1800

## 2015-05-20 NOTE — ED Provider Notes (Signed)
  Face-to-face evaluation   History: She reports onset of facial numbness and right upper arm pain, while at church this morning. The numbness has persisted. Numbness is present in the right side of her face including for itchy lips and chin. She denies any weakness. She has recurrent chronic headaches 2-3 times a week that usually resolve when she sleeps. No formal diagnosis of migraine.  Physical exam: Alert, calm, cooperative. No dysarthria, aphasia or nystagmus. Intact light touch sensation of the face. Normal strength of arms bilaterally. No ataxia.  Medical screening examination/treatment/procedure(s) were conducted as a shared visit with non-physician practitioner(s) and myself.  I personally evaluated the patient during the encounter  Daleen Bo, MD 05/22/15 1800

## 2015-05-21 ENCOUNTER — Ambulatory Visit: Payer: BC Managed Care – PPO | Admitting: Oncology

## 2015-05-29 ENCOUNTER — Other Ambulatory Visit: Payer: Self-pay | Admitting: Nurse Practitioner

## 2015-06-04 ENCOUNTER — Ambulatory Visit (HOSPITAL_BASED_OUTPATIENT_CLINIC_OR_DEPARTMENT_OTHER): Payer: BC Managed Care – PPO | Admitting: Nurse Practitioner

## 2015-06-04 ENCOUNTER — Telehealth: Payer: Self-pay | Admitting: Oncology

## 2015-06-04 ENCOUNTER — Ambulatory Visit: Payer: BC Managed Care – PPO | Admitting: Oncology

## 2015-06-04 ENCOUNTER — Encounter: Payer: Self-pay | Admitting: Nurse Practitioner

## 2015-06-04 VITALS — BP 98/67 | HR 84 | Temp 98.0°F | Resp 18 | Ht 62.0 in | Wt 176.0 lb

## 2015-06-04 DIAGNOSIS — Z853 Personal history of malignant neoplasm of breast: Secondary | ICD-10-CM | POA: Diagnosis not present

## 2015-06-04 DIAGNOSIS — C50111 Malignant neoplasm of central portion of right female breast: Secondary | ICD-10-CM

## 2015-06-04 DIAGNOSIS — Z79810 Long term (current) use of selective estrogen receptor modulators (SERMs): Secondary | ICD-10-CM

## 2015-06-04 MED ORDER — TAMOXIFEN CITRATE 20 MG PO TABS: ORAL_TABLET | ORAL | Status: DC

## 2015-06-04 NOTE — Telephone Encounter (Signed)
called patient and she is aware of her one year appointment

## 2015-06-04 NOTE — Progress Notes (Signed)
ID: Brittany Colon   DOB: 20-Apr-1961  MR#: [id number]  UOH#:[id number]  PCP: Thressa Sheller, MD GYN: Bobbye Charleston SU: Excell Seltzer OTHER MD: Gery Pray  CHIEF COMPLAINT: Ductal carcinoma in situ  CURRENT TREATMENT: Tamoxifen  BREAST CANCER HISTORY: From the original intake note:  "Brittany Colon" has lumpy breasts, and has had diagnostic mammography previously. Screening mammography 08/14/2011 suggested a possible change in the right breast, and diagnostic right breast mammography 09/09/2011 found a cluster of coarse calcifications superiorly in the right breast. These were felt to be likely benign, and six-month followup was recommended. However the next mammogram was 12/14/2012. This showed the calcifications in question to have increased since the prior study. The covered an area of 7 mm. 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  Bilateral breast MRI 12/18/2012 showed only post biopsy change and multiple breast cysts bilaterally. The patient's subsequent history is as detailed below  INTERVAL HISTORY: Brittany Colon for follow up of her noninvasive breast cancer. She has been on tamoxifen since September 2014. She tolerates this drug well with few complaints. She does have some infrequent mild hot flashes, but she does not mind them because she is generally cold nature. The interval history is generally unremarkable.  REVIEW OF SYSTEMS: Brittany Colon denies fevers, chills, nausea, or vomiting. She has irregular bowel movements at baseline, but does not take any stool softeners or laxatives for this. Her appetite is healthy. She has recently started back working out to video tapes. She has some leg cramps on occasion, but otherwise denies pain. She denies shortness of breath, chest pain, cough, or palpitations. She has rare headaches, and denies dizziness or vision changes. A detailed review of  systems is otherwise stable.  PAST MEDICAL HISTORY: Past Medical History  Diagnosis Date  . Headache(784.0)   . Breast cancer 12/14/12    right  . Breast cyst 12/18/12    mult bilaterally per MRI  . Allergy   . History of radiation therapy 02/09/2013-03/31/2013    62.4 gray to the right breast    PAST SURGICAL HISTORY: Past Surgical History  Procedure Laterality Date  . Carpal tunnel release Right   . Foot surgery  1993    bone spur lt foot  . Breast lumpectomy with needle localization Right 01/10/2013    Procedure: BREAST LUMPECTOMY WITH NEEDLE LOCALIZATION;  Surgeon: Edward Jolly, MD;  Location: [facility];  Service: General;  Laterality: Right;    FAMILY HISTORY Family History  Problem Relation Age of Onset  . Heart disease Mother    The patient's father died from Parkinson's disease at age 46. The patient's mother died from a myocardial infarction at age 31. Lives has 3 brothers, one sister. There is no history of cancer in the family to her knowledge.   GYNECOLOGIC HISTORY: Menarche age 38, first live birth age 39. She is GX P2. She still having regular periods. She never took birth control pills.   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 Her husband [name] is disabled secondary to sleep apnea and fibromyalgia. Daughter Pami Wool works as a Psychologist, sport and exercise at [employer] son Kambry Takacs is Scientist, physiological at [employer]. The patient has 1 grandchild. She attends a [employer] in [place] DIRECTIVES:Not in place   HEALTH MAINTENANCE: Social History  Substance Use Topics  .  Smoking status: Never Smoker   . Smokeless tobacco: Not on file  . Alcohol Use: No     Colonoscopy:  PAP:  Bone density:  Lipid panel:  Allergies  Allergen Reactions  . Sulfur     vomiting    Current Outpatient Prescriptions  Medication Sig Dispense  Refill  . Glucosamine HCl (GLUCOSAMINE PO) Take 1 tablet by mouth daily.    . Melaton-Thean-Cham-PassF-LBalm (MELATONIN + L-THEANINE PO) Take 1 capsule by mouth at bedtime as needed.    . Multiple Vitamins-Minerals (MULTIVITAMIN PO) Take by mouth daily.    . tamoxifen (NOLVADEX) 20 MG tablet TAKE 1 TABLET (20 MG TOTAL) BY MOUTH DAILY. 90 tablet 2   No current facility-administered medications for this visit.    OBJECTIVE: Middle-aged [name] who appears younger than stated age 54 Vitals:   06/04/15 1131  BP: 98/67  Pulse: 84  Temp: 98 F (36.7 C)  Resp: 18     Body mass index is 32.18 kg/(m^2).    ECOG FS: 0  Skin: warm, dry  HEENT: sclerae anicteric, conjunctivae pink, oropharynx clear. No thrush or mucositis.  Lymph Nodes: No cervical or supraclavicular lymphadenopathy  Lungs: clear to auscultation bilaterally, no rales, wheezes, or rhonci  Heart: regular rate and rhythm  Abdomen: round, soft, non tender, positive bowel sounds  Musculoskeletal: No focal spinal tenderness, no peripheral edema  Neuro: non focal, well oriented, positive affect  Breasts: right breast status lumpectomy and radiation. Tenderness throughout exam. No evidence of recurrent disease. Right axilla benign. Right breast unremarkable.  LAB RESULTS: Lab Results  Component Value Date   WBC 5.6 05/20/2015   NEUTROABS 2.5 05/20/2015   HGB 13.3 05/20/2015   HCT 39.0 05/20/2015   MCV 91.2 05/20/2015   PLT 186 05/20/2015      Chemistry      Component Value Date/Time   NA 140 05/20/2015 1719   NA 141 12/22/2012 1219   K 4.1 05/20/2015 1719   K 4.1 12/22/2012 1219   CL 105 05/20/2015 1719   CL 107 12/22/2012 1219   CO2 24 05/20/2015 1707   CO2 28 12/22/2012 1219   BUN 12 05/20/2015 1719   BUN 14.3 12/22/2012 1219   CREATININE 0.70 05/20/2015 1719   CREATININE 0.8 12/22/2012 1219      Component Value Date/Time   CALCIUM 8.8* 05/20/2015 1707   CALCIUM 9.2 12/22/2012 1219   ALKPHOS  41 05/20/2015 1707   ALKPHOS 61 12/22/2012 1219   AST 22 05/20/2015 1707   AST 16 12/22/2012 1219   ALT 14 05/20/2015 1707   ALT 11 12/22/2012 1219   BILITOT 0.5 05/20/2015 1707   BILITOT 0.28 12/22/2012 1219       No results found for: LABCA2  No components found for: LABCA125  No results for input(s): INR in the last 168 hours.  Urinalysis    Component Value Date/Time   LABSPEC 1.020 11/21/2008 1036   PHURINE 6.5 11/21/2008 1036   GLUCOSEU NEGATIVE 11/21/2008 1036   HGBUR NEGATIVE 11/21/2008 1036   BILIRUBINUR NEGATIVE 11/21/2008 1036   KETONESUR NEGATIVE 11/21/2008 1036   PROTEINUR NEGATIVE 11/21/2008 1036   UROBILINOGEN 0.2 11/21/2008 1036   NITRITE NEGATIVE 11/21/2008 1036   LEUKOCYTESUR  11/21/2008 1036    NEGATIVE Biochemical Testing Only. Please order routine urinalysis from main lab if confirmatory testing is needed.    STUDIES: EXAM: DIGITAL DIAGNOSTIC BILATERAL MAMMOGRAM WITH CAD  COMPARISON: 09/27/2013 dating back to 12/28/2009.  ACR Breast Density Category d:  The breast tissue is extremely dense, which lowers the sensitivity of mammography.  FINDINGS: CC and MLO views of both breasts and a spot tangential view of the lumpectomy site in the right breast were obtained. Post lumpectomy scarring in the upper outer right breast, middle and posterior depth. No findings suspicious for malignancy in either breast.  Mammographic images were processed with CAD.  IMPRESSION: No specific mammographic evidence of malignancy. Expected post lumpectomy changes in the right breast.  RECOMMENDATION: Bilateral diagnostic mammography in 1 Colon.  I have discussed the findings and recommendations with the patient. Results were also provided in writing at the conclusion of the visit. If applicable, a reminder letter will be sent to the patient regarding the next appointment.  BI-RADS CATEGORY 2: Benign.   Electronically Signed  By: Evangeline Dakin M.D.  On: 09/28/2014 15:49   ASSESSMENT: 54 y.o. [place] woman status post right breast upper outer quadrant lumpectomy 01/10/2013 for a high-grade ductal carcinoma in situ, 100% estrogen receptor positive, 100% progesterone receptor positive.  (1) adjuvant radiation  completed 03/31/2013  (2) tamoxifen started Sept 2014   PLAN: Brittany Colon is doing well as far as her breast cancer is concerned. She is now 2 years out from her definitive surgery with no evidence of recurrent disease. She is tolerating the tamoxifen well and will continue this drug for at least 5 years of antiestrogen therapy.   Brittany Colon for labs and a follow up visit. She will have a repeat mammogram in February 2017. She understands and agrees with this plan. She knows the goal of treatment in her case is cure. She has been encouraged to call with any issues that might arise before her next visit here.  Brittany Colon    06/04/2015

## 2015-06-17 ENCOUNTER — Other Ambulatory Visit: Payer: Self-pay | Admitting: Oncology

## 2015-08-28 ENCOUNTER — Other Ambulatory Visit: Payer: Self-pay | Admitting: Oncology

## 2015-08-28 DIAGNOSIS — Z853 Personal history of malignant neoplasm of breast: Secondary | ICD-10-CM

## 2015-10-01 ENCOUNTER — Ambulatory Visit
Admission: RE | Admit: 2015-10-01 | Discharge: 2015-10-01 | Disposition: A | Discharge status: Home / Self Care | Payer: BC Managed Care – PPO | Source: Ambulatory Visit | Attending: Oncology | Admitting: Oncology

## 2015-10-01 ENCOUNTER — Other Ambulatory Visit: Payer: Self-pay | Admitting: Oncology

## 2015-10-01 DIAGNOSIS — Z853 Personal history of malignant neoplasm of breast: Secondary | ICD-10-CM

## 2016-06-03 ENCOUNTER — Ambulatory Visit (HOSPITAL_BASED_OUTPATIENT_CLINIC_OR_DEPARTMENT_OTHER): Payer: BC Managed Care – PPO | Admitting: Oncology

## 2016-06-03 DIAGNOSIS — Z79810 Long term (current) use of selective estrogen receptor modulators (SERMs): Secondary | ICD-10-CM

## 2016-06-03 DIAGNOSIS — Z170 Estrogen receptor positive status [ER+]: Secondary | ICD-10-CM

## 2016-06-03 DIAGNOSIS — D0511 Intraductal carcinoma in situ of right breast: Secondary | ICD-10-CM | POA: Diagnosis not present

## 2016-06-03 NOTE — Progress Notes (Signed)
ID: Brittany Colon   DOB: [birth year]  MR#: [id number]  [id number]  PCP: Thressa Sheller, MD GYN: Bobbye Charleston SU: Excell Seltzer OTHER MD: Gery Pray  CHIEF COMPLAINT: Ductal carcinoma in situ  CURRENT TREATMENT: Tamoxifen  BREAST CANCER HISTORY: From the original intake note:  "Brittany Colon" has lumpy breasts, and has had diagnostic mammography previously. Screening mammography 08/14/2011 suggested a possible change in the right breast, and diagnostic right breast mammography 09/09/2011 found a cluster of coarse calcifications superiorly in the right breast. These were felt to be likely benign, and six-month followup was recommended. However the next mammogram was 12/14/2012. This showed the calcifications in question to have increased since the prior study. The covered an area of 7 mm. 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  Bilateral breast MRI 12/18/2012 showed only post biopsy change and multiple breast cysts bilaterally. The patient's subsequent history is as detailed below  INTERVAL HISTORY: Brittany Colon returns for follow up of her ductal carcinoma in situ. She continues on tamoxifen, which she tolerates well. She has occasional hot flashes. She does have cramps which are more prominent at night. She obtains it at a good price.  REVIEW OF SYSTEMS: Brittany Colon has developed significant left knee pain over the last month. She is walking with a bit of a limp. She is doing the best she can to stay exercise, but this is limiting her. She sleeps poorly. She has occasional headaches. Psych from this a detailed review of systems today was noncontributory  PAST MEDICAL HISTORY: Past Medical History:  Diagnosis Date  . Allergy   . Breast cancer ([place]) 12/14/12   right  . Breast cyst 12/18/12   mult bilaterally per MRI  . Headache(784.0)   . History of radiation therapy 02/09/2013-03/31/2013   62.4 gray to the right breast    PAST SURGICAL HISTORY: Past Surgical History:  Procedure Laterality Date  . BREAST LUMPECTOMY WITH NEEDLE LOCALIZATION Right 01/10/2013   Procedure: BREAST LUMPECTOMY WITH NEEDLE LOCALIZATION;  Surgeon: Edward Jolly, MD;  Location: [facility];  Service: General;  Laterality: Right;  . CARPAL TUNNEL RELEASE Right   . FOOT SURGERY  1993   bone spur lt foot    FAMILY HISTORY Family History  Problem Relation Age of Onset  . Heart disease Mother    The patient's father died from Parkinson's disease at age 9. The patient's mother died from a myocardial infarction at age 73. Lives has 3 brothers, one sister. There is no history of cancer in the family to her knowledge.   GYNECOLOGIC HISTORY: Menarche age 69, first live birth age 59. She is GX P2. She still having regular periods. She never took birth control pills.   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 Her husband [name] is disabled secondary to sleep apnea and fibromyalgia. Daughter Sreenidhi Maraldo works as a Psychologist, sport and exercise at [employer] son Camela Ryner is Scientist, physiological at [employer]. The patient has 1 grandchild. She attends a [employer] in [place] DIRECTIVES:Not in place   HEALTH MAINTENANCE: Social History  Substance Use Topics  . Smoking status: Never Smoker  . Smokeless tobacco: Not on file  . Alcohol use No     Colonoscopy:  PAP:  Bone density:  Lipid panel:  Allergies  Allergen Reactions  . Sulfur  vomiting    Current Outpatient Prescriptions  Medication Sig Dispense Refill  . Glucosamine HCl (GLUCOSAMINE PO) Take 1 tablet by mouth daily.    . Melaton-Thean-Cham-PassF-LBalm (MELATONIN + L-THEANINE PO) Take 1 capsule by mouth at bedtime as needed.    . Multiple Vitamins-Minerals (MULTIVITAMIN PO) Take by mouth daily.    . tamoxifen  (NOLVADEX) 20 MG tablet TAKE 1 TABLET (20 MG TOTAL) BY MOUTH DAILY. 90 tablet 3  . tamoxifen (NOLVADEX) 20 MG tablet TAKE 1 TABLET BY MOUTH EVERY DAY 90 tablet 2   No current facility-administered medications for this visit.     OBJECTIVE: Middle-aged [name] In no acute distress  Vitals:   06/03/16 1146  BP: 118/82  Pulse: 92  Resp: 18  Temp: 99.1 F (37.3 C)     Body mass index is 33.93 kg/m.    ECOG FS: 1  Sclerae unicteric, pupils round and equal Oropharynx clear and moist-- no thrush or other lesions No cervical or supraclavicular adenopathy Lungs no rales or rhonchi Heart regular rate and rhythm Abd soft, nontender, positive bowel sounds MSK no focal spinal tenderness, no upper extremity lymphedema; walks with a limp secondary to left knee pain Neuro: nonfocal, well oriented, appropriate affect Breasts: The right breast is status post lumpectomy and radiation. There is no evidence of local recurrence. The left breast is unremarkable.    LAB RESULTS: Lab Results  Component Value Date   WBC 5.6 05/20/2015   NEUTROABS 2.5 05/20/2015   HGB 13.3 05/20/2015   HCT 39.0 05/20/2015   MCV 91.2 05/20/2015   PLT 186 05/20/2015      Chemistry      Component Value Date/Time   NA 140 05/20/2015 1719   NA 141 12/22/2012 1219   K 4.1 05/20/2015 1719   K 4.1 12/22/2012 1219   CL 105 05/20/2015 1719   CL 107 12/22/2012 1219   CO2 24 05/20/2015 1707   CO2 28 12/22/2012 1219   BUN 12 05/20/2015 1719   BUN 14.3 12/22/2012 1219   CREATININE 0.70 05/20/2015 1719   CREATININE 0.8 12/22/2012 1219      Component Value Date/Time   CALCIUM 8.8 (L) 05/20/2015 1707   CALCIUM 9.2 12/22/2012 1219   ALKPHOS 41 05/20/2015 1707   ALKPHOS 61 12/22/2012 1219   AST 22 05/20/2015 1707   AST 16 12/22/2012 1219   ALT 14 05/20/2015 1707   ALT 11 12/22/2012 1219   BILITOT 0.5 05/20/2015 1707   BILITOT 0.28 12/22/2012 1219       No results found for: LABCA2  No  components found for: LABCA125  No results for input(s): INR in the last 168 hours.  Urinalysis    Component Value Date/Time   LABSPEC 1.020 11/21/2008 1036   PHURINE 6.5 11/21/2008 1036   GLUCOSEU NEGATIVE 11/21/2008 1036   HGBUR NEGATIVE 11/21/2008 1036   BILIRUBINUR NEGATIVE 11/21/2008 1036   KETONESUR NEGATIVE 11/21/2008 1036   PROTEINUR NEGATIVE 11/21/2008 1036   UROBILINOGEN 0.2 11/21/2008 1036   NITRITE NEGATIVE 11/21/2008 1036   LEUKOCYTESUR  11/21/2008 1036    NEGATIVE Biochemical Testing Only. Please order routine urinalysis from main lab if confirmatory testing is needed.    STUDIES: CLINICAL DATA:  Annual examination. 55 year old patient with history of right breast cancer, status post lumpectomy in 2014. The patient is asymptomatic.  EXAM: DIGITAL DIAGNOSTIC BILATERAL MAMMOGRAM WITH 3D TOMOSYNTHESIS AND CAD  COMPARISON:  Previous exam(s).  ACR Breast Density Category c: The breast tissue is heterogeneously  dense, which may obscure small masses.  FINDINGS: There are stable lumpectomy changes in the deep upper outer right breast. The patient has stable punctate calcifications scattered throughout both breasts. No suspicious grouping of calcifications is identified in either breast. No nonsurgical distortion or mass is seen bilaterally.  Mammographic images were processed with CAD.  IMPRESSION: No evidence of malignancy in either breast. Lumpectomy changes on the right.  RECOMMENDATION: Diagnostic mammogram is suggested in 1 year. (Code:[id number]-B-01Y)  I have discussed the findings and recommendations with the patient. Results were also provided in writing at the conclusion of the visit. If applicable, a reminder letter will be sent to the patient regarding the next appointment.  BI-RADS CATEGORY  2: Benign.   Electronically Signed   By: Curlene Dolphin M.D.   On: 10/01/2015 14:59       ASSESSMENT: 55 y.o. 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  (1) adjuvant radiation  completed 03/31/2013  (2) tamoxifen started Sept 2014   PLAN: Brittany Colon is now 3-1/2 years out from definitive surgery for her noninvasive breast cancer with no evidence of disease recurrence. This is very favorable.  She continues to tolerate tamoxifen without significant side effects. The one problem she is having his cramps. This may or may not be related to the tamoxifen. We discussed hydration, the possibility of taking a half a cup of tonic water at bedtime, and massage and other maneuvers to help with that problem.  She does have significant knee pain and it is affecting her ability to walk. We talked about ice, rest, and elevation, but also I'm referring her to our physical therapy department to see what she can do to improve that problem. If it gets any worse at all she will need orthopedic  She will see me again in one year. She knows to call for any problems that may develop before that visit.  Valrie Jia C    06/03/2016

## 2016-06-18 ENCOUNTER — Ambulatory Visit: Payer: BC Managed Care – PPO | Attending: Oncology | Admitting: Physical Therapy

## 2016-06-18 DIAGNOSIS — M25662 Stiffness of left knee, not elsewhere classified: Secondary | ICD-10-CM | POA: Diagnosis present

## 2016-06-18 DIAGNOSIS — M6281 Muscle weakness (generalized): Secondary | ICD-10-CM

## 2016-06-18 DIAGNOSIS — R262 Difficulty in walking, not elsewhere classified: Secondary | ICD-10-CM | POA: Diagnosis present

## 2016-06-18 DIAGNOSIS — M25562 Pain in left knee: Secondary | ICD-10-CM | POA: Diagnosis not present

## 2016-06-18 NOTE — Therapy (Signed)
[place], [place], [address] Phone: [phone]   Fax:  [phone]  Physical Therapy Evaluation  Patient Details  Name: Brittany Colon MRN: [id number] Date of Birth: 14-Aug-1961 Referring Provider: Dr. Lurline Del  Encounter Date: 06/18/2016      PT End of Session - 06/18/16 1705    Visit Number 1   Number of Visits 9   Date for PT Re-Evaluation 07/18/16   PT Start Time 1603   PT Stop Time 1659   PT Time Calculation (min) 56 min   Activity Tolerance Patient tolerated treatment well   Behavior During Therapy [facility] for tasks assessed/performed      Past Medical History:  Diagnosis Date  . Allergy   . Breast cancer ([place]) 12/14/12   right  . Breast cyst 12/18/12   mult bilaterally per MRI  . Headache(784.0)   . History of radiation therapy 02/09/2013-03/31/2013   62.4 gray to the right breast    Past Surgical History:  Procedure Laterality Date  . BREAST LUMPECTOMY WITH NEEDLE LOCALIZATION Right 01/10/2013   Procedure: BREAST LUMPECTOMY WITH NEEDLE LOCALIZATION;  Surgeon: Edward Jolly, MD;  Location: [place];  Service: General;  Laterality: Right;  . CARPAL TUNNEL RELEASE Right   . FOOT SURGERY  1993   bone spur lt foot    There were no vitals filed for this visit.       Subjective Assessment - 06/18/16 1611    Subjective Left knee pain; has been going on and off for about six months, but has gotten bad in the last six weeks.   Pertinent History Left knee pain for six months.  Right knee had bothered her and primary care doctor took an x-ray over a year ago, which showed some arthritis, but the left knee is really the problem now.  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  Sees Dr. Jana Hakim once a year and recently saw him for follow-up and mentioned this knee pain.   Patient Stated Goals "I  want my knee not to hurt, to be able to really do my exercise so I can lose some weight."  Wants to be able to squat down without having the pain.  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   Currently in Pain? Yes   Pain Score 4    Pain Location Knee   Pain Orientation Left;Posterior;Lateral   Pain Descriptors / Indicators Sharp;Other (Comment)  like thousands of needles at one time   Pain Type Chronic pain   Pain Onset More than a month ago   Pain Frequency Constant   Aggravating Factors  unsure, but sometimes notices it when she first stands up   Pain Relieving Factors more walking sometimes is better; ice sometimes helps (ice massageP            [facility] PT Assessment - 06/18/16 0001      Assessment   Medical Diagnosis Left knee pain  h/o right DCIS with lumpectomy and radiation   Referring Provider Dr. Sarajane Jews Magrinat   Onset Date/Surgical Date --  about six months ago with recent worsening   Hand Dominance Right   Prior Therapy none     Precautions   Precautions Other (comment)   Precaution Comments h/o right DCIS   Required Braces or Orthoses --  none     Restrictions   Weight Bearing Restrictions No     Balance Screen   Has the patient fallen in the past 6 months No   Has the patient had a decrease in activity level because of a fear of falling?  No   Is the patient reluctant to leave their home because of a fear of falling?  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   Leisure has tried to start  back to a program she has done before; uses a video at home, 30 minutes a day, five days a week  just works through painful parts of this     Cognition   Overall Cognitive Status Within Functional Limits for tasks assessed     Functional Tests   Functional tests Sit to Stand     Sit to Stand   Comments standing elicits left knee pain  15 times in 30 seconds     ROM / Strength   AROM / PROM / Strength AROM;Strength     AROM   Overall AROM  Deficits   AROM Assessment Site Knee   Right/Left Knee Right;Left   Right Knee Extension --  WFL   Right Knee Flexion 120   Left Knee Extension --  WFL, but painful at end range   Left Knee Flexion 115  painful at end range     Strength   Overall Strength Comments ankle dorsiflexors 5/5 bilat.; ankle AROM WFL throughout   Strength Assessment Site Hip;Knee   Right/Left Hip Right;Left   Right Hip Flexion 5/5   Right Hip Extension 4+/5   Right Hip ABduction 5/5   Left Hip Flexion 4+/5   Left Hip Extension 4/5   Left Hip ABduction 4+/5   Right/Left Knee Right;Left   Right Knee Flexion 4+/5   Right Knee Extension 5/5   Left Knee Flexion 4/5   Left Knee Extension 4/5     Palpation   Palpation comment tender to palpation at posterolateral aspect of left knee joint     Special Tests    Special Tests Knee Special Tests   Knee Special tests  other     other    Side  Left   Comments pain with lateral ligament stress     Ambulation/Gait   Ambulation/Gait Yes   Ambulation/Gait Assistance 6: Modified independent (Device/Increase time)   Stairs Yes   Stairs Assistance 6: Modified independent (Device/Increase time)   Stair Management Technique No rails;Alternating pattern  but painful left knee so usually does step-to   Gait Comments walks with slightly stiff left knee (says it hurts to bend it) and mild antalgic gait                                   [facility] Goals - 06/18/16 1718      CC Long  Term Goal  #1   Title left knee pain will be reduced by 75%   Time  4   Period Weeks   Status New     CC Long Term Goal  #2   Title pt. will be knowledgeable about HEP for left knee and hip strengthening, and about self-care for avoiding aggravating knee pain   Time 4   Period Weeks   Status New     CC Long Term Goal  #3   Title Patient will be able to stand after sitting a few minutes with normal body mechanic   Time 4   Period Weeks   Status New     CC Long Term Goal  #4   Title Pt. will demonstrate gait with minimal to no deviation related to left knee pain   Time 4   Period Weeks   Status New            Plan - 06/18/16 1707    Clinical Impression Statement Patient with a h/o breast cancer (DCIS) on right 3 years ago, now with left knee pain of six months' duration, but with exacerbation in the last month or so.  Pain is at posterolateral knee, and she has difficulty identifying aggravating factors, but it is increased when she goes to stand after sitting a few minutes and relieved a bit as she walks a few steps.  She is tender to palpation at posterolateral knee.  She has pain at end range of active flexion and extension of left knee and just slightly limited active flexion.  Gait is with slightly stiff left knee and antalgic.  She does not appear to have ligamentous laxity, but does have pain with pushing left knee into varus position (so stressing lateral aspect of knee).  We discussed treatment plan and goals and she is agreeable.   Rehab Potential Good   Clinical Impairments Affecting Rehab Potential none   PT Frequency 2x / week   PT Duration 4 weeks   PT Treatment/Interventions ADLs/Self Care Home Management;Electrical Stimulation;Iontophoresis 4mg /ml Dexamethasone;Moist Heat;Gait training;Stair training;Functional mobility training;Therapeutic exercise;Patient/family education;Manual techniques;Passive range of motion;Taping;Vasopneumatic Device   PT Next Visit Plan  Begin strengthening left knee and hip; instruct in beginning HEP.  Consider focus on isometric and/or functional strengthening.  If okayed by Dr. Jana Hakim, begin iontophoresis to area of pain at left posterolateral knee.  Otherwise, use cold pack or ice massage at end of session.   Consulted and Agree with Plan of Care Patient      Patient will benefit from skilled therapeutic intervention in order to improve the following deficits and impairments:  Abnormal gait, Pain, Decreased strength, Decreased mobility  Visit Diagnosis: Left knee pain, unspecified chronicity - Plan: PT plan of care cert/re-cert  Difficulty in walking, not elsewhere classified - Plan: PT plan of care cert/re-cert  Muscle weakness (generalized) - Plan: PT plan of care cert/re-cert  Stiffness of left knee, not elsewhere classified - Plan: PT plan of care cert/re-cert     Problem List Patient Active Problem List   Diagnosis Date Noted  . Ductal carcinoma in situ (DCIS) of right breast 06/03/2016    [name],[name] 06/18/2016, 5:22 PM  [place], [place], [address] Phone: [phone]   Fax:  [phone]  Name: SHIRYL WALKUP MRN: [id number] Date of Birth: 1961-06-17  [name], PT 06/18/16 5:23 PM

## 2016-06-19 ENCOUNTER — Ambulatory Visit: Payer: BC Managed Care – PPO | Admitting: Physical Therapy

## 2016-06-24 ENCOUNTER — Ambulatory Visit: Payer: BC Managed Care – PPO

## 2016-06-25 ENCOUNTER — Encounter: Payer: BC Managed Care – PPO | Admitting: Physical Therapy

## 2016-06-30 ENCOUNTER — Ambulatory Visit: Payer: BC Managed Care – PPO | Attending: Oncology | Admitting: Physical Therapy

## 2016-06-30 DIAGNOSIS — R262 Difficulty in walking, not elsewhere classified: Secondary | ICD-10-CM

## 2016-06-30 DIAGNOSIS — M25562 Pain in left knee: Secondary | ICD-10-CM | POA: Diagnosis present

## 2016-06-30 DIAGNOSIS — M25662 Stiffness of left knee, not elsewhere classified: Secondary | ICD-10-CM | POA: Insufficient documentation

## 2016-06-30 DIAGNOSIS — M6281 Muscle weakness (generalized): Secondary | ICD-10-CM | POA: Diagnosis present

## 2016-06-30 NOTE — Patient Instructions (Signed)
HIP: Hamstrings - Long Sitting     Copyright  VHI. All rights reserved.  ABDUCTION: Side-Lying (Active)    Lie on left side, top leg straight. Raise top leg as far as possible. Use __0_ lbs. Complete _1__ sets of __10_ repetitions. Perform __1_ sessions per day.  Do for each leg.  Gradually increase to two and then three sets on each leg.  http://gtsc.exer.us/95   Copyright  VHI. All rights reserved.  Straight Leg Raise (Prone)    Abdomen and head supported, keep left knee locked and raise leg at hip. Avoid arching low back. Repeat __10__ times per set. Do __1__ sets per session. Do __1__ sessions per day.  Do each leg.  Gradually increase to two and three sets on each leg.  http://orth.exer.us/1113   Copyright  VHI. All rights reserved.  Straight Leg Raise    Tighten stomach and slowly raise locked right leg __15__ inches from floor. Repeat __10__ times per set. Do __1__ sets per session. Do _1___ sessions per day.  Gradually increase to two and three sets on each leg.  http://orth.exer.us/1103   Copyright  VHI. All rights reserved.     Place one leg on surface with knee straight. Lean forward keeping back straight. Hold _30__ seconds. __1-2_ reps per set, _1-2__ sets per day, _6__ days per week  IONTOPHORESIS PATIENT PRECAUTIONS & CONTRAINDICATIONS:  . Redness under one or both electrodes can occur.  This characterized by a uniform redness that usually disappears within 12 hours of treatment. . Small pinhead size blisters may result in response to the drug.  Contact your physician if the problem persists more than 24 hours. . On rare occasions, iontophoresis therapy can result in temporary skin reactions such as rash, inflammation, irritation or burns.  The skin reactions may be the result of individual sensitivity to the ionic solution used, the condition of the skin at the start of treatment, reaction to the materials in the electrodes, allergies or sensitivity to  dexamethasone, or a poor connection between the patch and your skin.  Discontinue using iontophoresis if you have any of these reactions and report to your therapist. . Remove the Patch or electrodes if you have any undue sensation of pain or burning during the treatment and report discomfort to your therapist. . Tell your Therapist if you have had known adverse reactions to the application of electrical current. . If using the Patch, the LED light will turn off when treatment is complete and the patch can be removed.  Approximate treatment time is 1-3 hours.  Remove the patch when light goes off or after 6 hours. . The Patch can be worn during normal activity, however excessive motion where the electrodes have been placed can cause poor contact between the skin and the electrode or uneven electrical current resulting in greater risk of skin irritation. Marland Kitchen Keep out of the reach of children.   . DO NOT use if you have a cardiac pacemaker or any other electrically sensitive implanted device. . DO NOT use if you have a known sensitivity to dexamethasone. . DO NOT use during Magnetic Resonance Imaging (MRI). . DO NOT use over broken or compromised skin (e.g. sunburn, cuts, or acne) due to the increased risk of skin reaction. . DO NOT SHAVE over the area to be treated:  To establish good contact between the Patch and the skin, excessive hair may be clipped. . DO NOT place the Patch or electrodes on or over your eyes, directly over  your heart, or brain. . DO NOT reuse the Patch or electrodes as this may cause burns to occur.

## 2016-06-30 NOTE — Therapy (Signed)
[place], [place], [address] Phone: [phone]   Fax:  [phone]  Physical Therapy Treatment  Patient Details  Name: Brittany Colon MRN: [id number] Date of Birth: May 05, 1961 Referring Provider: Dr. Lurline Del  Encounter Date: 06/30/2016      PT End of Session - 06/30/16 1728    Visit Number 2   Number of Visits 9   Date for PT Re-Evaluation 07/18/16   PT Start Time 1346   PT Stop Time 1431   PT Time Calculation (min) 45 min   Activity Tolerance Patient tolerated treatment well   Behavior During Therapy [facility] for tasks assessed/performed      Past Medical History:  Diagnosis Date  . Allergy   . Breast cancer ([place]) 12/14/12   right  . Breast cyst 12/18/12   mult bilaterally per MRI  . Headache(784.0)   . History of radiation therapy 02/09/2013-03/31/2013   62.4 gray to the right breast    Past Surgical History:  Procedure Laterality Date  . BREAST LUMPECTOMY WITH NEEDLE LOCALIZATION Right 01/10/2013   Procedure: BREAST LUMPECTOMY WITH NEEDLE LOCALIZATION;  Surgeon: Edward Jolly, MD;  Location: [place];  Service: General;  Laterality: Right;  . CARPAL TUNNEL RELEASE Right   . FOOT SURGERY  1993   bone spur lt foot    There were no vitals filed for this visit.      Subjective Assessment - 06/30/16 1346    Subjective Knee is about the same.  The pain this weekend was really bad--pushing an eight or nine.   Currently in Pain? Yes   Pain Score 6    Pain Location Knee   Pain Orientation Left;Posterior;Lateral   Pain Descriptors / Indicators Sharp   Pain Radiating Towards sometimes down the back part of the leg   Pain Onset More than a month ago   Aggravating Factors  unsure   Pain Relieving Factors elevating leg             [facility] PT Assessment - 06/30/16 0001      Palpation   Palpation comment tenderness localized today to lateral hamstring  tendon just above knee joint                     [facility] Adult PT Treatment/Exercise - 06/30/16 0001      Self-Care   Self-Care Other Self-Care Comments   Other Self-Care Comments  Educated patient re: iontophoresis      Exercises   Exercises Knee/Hip     Knee/Hip Exercises: Seated   Other Seated Knee/Hip Exercises hamstring stretch, 30 seconds each side     Knee/Hip Exercises: Supine   Straight Leg Raises AROM;Right;Left;10 reps   Knee Flexion Right;Left;5 reps;2 sets  isometric holds; done with knees over bolster     Knee/Hip Exercises: Sidelying   Hip ABduction AROM;Right;Left;10 reps  not easy     Knee/Hip Exercises: Prone   Straight Leg Raises AROM;Right;Left;5 reps  painful on left     Modalities   Modalities Iontophoresis     Iontophoresis   Type of Iontophoresis Dexamethasone   Location left posterolateral knee just inferior to joint line   Dose 1 ml. in STAT patch   Time 6 hour wear time                PT Education - 06/30/16 1726    Education provided Yes   Education Details SLR in supine, prone,  and sidelying; hamstring stretches   Person(s) Educated Patient   Methods Explanation;Demonstration;Verbal cues;Handout   Comprehension Verbalized understanding;Returned demonstration                [facility] Goals - 06/18/16 1718      CC Long Term Goal  #1   Title left knee pain will be reduced by 75%   Time 4   Period Weeks   Status New     CC Long Term Goal  #2   Title pt. will be knowledgeable about HEP for left knee and hip strengthening, and about self-care for avoiding aggravating knee pain   Time 4   Period Weeks   Status New     CC Long Term Goal  #3   Title Patient will be able to stand after sitting a few minutes with normal body mechanic   Time 4   Period Weeks   Status New     CC Long Term Goal  #4   Title Pt. will demonstrate gait with minimal to no deviation related to left knee pain   Time 4    Period Weeks   Status New            Plan - 06/30/16 1729    Clinical Impression Statement Patient did well with initial session today, tolerating exercise except with some pain on left with prone straight leg raise; instructed thoroughly in what to expect with iontophoresis and in when to remove patch.  Soreness did appear more obviously to be at hamstring tendon area today.   Rehab Potential Good   Clinical Impairments Affecting Rehab Potential none   PT Frequency 2x / week   PT Duration 4 weeks   PT Treatment/Interventions ADLs/Self Care Home Management;Electrical Stimulation;Iontophoresis 4mg /ml Dexamethasone;Moist Heat;Gait training;Stair training;Functional mobility training;Therapeutic exercise;Patient/family education;Manual techniques;Passive range of motion;Taping;Vasopneumatic Device   PT Next Visit Plan Review HEP as needed; continue hip and knee strengthening to tolerance.  Continue iontophoresis.   PT Home Exercise Plan SLRs in three positions, hamstring stretches   Consulted and Agree with Plan of Care Patient      Patient will benefit from skilled therapeutic intervention in order to improve the following deficits and impairments:  Abnormal gait, Pain, Decreased strength, Decreased mobility  Visit Diagnosis: Left knee pain, unspecified chronicity  Difficulty in walking, not elsewhere classified  Muscle weakness (generalized)     Problem List Patient Active Problem List   Diagnosis Date Noted  . Ductal carcinoma in situ (DCIS) of right breast 06/03/2016    Cobe Viney 06/30/2016, 5:32 PM  [place], [place], [address] Phone: [phone]   Fax:  [phone]  Name: Brittany Colon MRN: [id number] Date of Birth: 08-31-1960  [name], PT 06/30/16 5:32 PM

## 2016-07-02 ENCOUNTER — Ambulatory Visit: Payer: BC Managed Care – PPO | Admitting: Physical Therapy

## 2016-07-02 DIAGNOSIS — M6281 Muscle weakness (generalized): Secondary | ICD-10-CM

## 2016-07-02 DIAGNOSIS — M25562 Pain in left knee: Secondary | ICD-10-CM | POA: Diagnosis not present

## 2016-07-02 DIAGNOSIS — R262 Difficulty in walking, not elsewhere classified: Secondary | ICD-10-CM

## 2016-07-02 NOTE — Therapy (Signed)
[name], [place], [address] Phone: [phone]   Fax:  [phone]  Physical Therapy Treatment  Patient Details  Name: Brittany Colon MRN: [id number] Date of Birth: Dec 30, 1960 Referring Provider: Dr. Lurline Del  Encounter Date: 07/02/2016      PT End of Session - 07/02/16 1546    Visit Number 3   Number of Visits 9   Date for PT Re-Evaluation 07/18/16   PT Start Time 1520   PT Stop Time 1550  2 charge units is correct   PT Time Calculation (min) 30 min   Behavior During Therapy [facility] for tasks assessed/performed      Past Medical History:  Diagnosis Date  . Allergy   . Breast cancer ([place]) 12/14/12   right  . Breast cyst 12/18/12   mult bilaterally per MRI  . Headache(784.0)   . History of radiation therapy 02/09/2013-03/31/2013   62.4 gray to the right breast    Past Surgical History:  Procedure Laterality Date  . BREAST LUMPECTOMY WITH NEEDLE LOCALIZATION Right 01/10/2013   Procedure: BREAST LUMPECTOMY WITH NEEDLE LOCALIZATION;  Surgeon: Edward Jolly, MD;  Location: [place];  Service: General;  Laterality: Right;  . CARPAL TUNNEL RELEASE Right   . FOOT SURGERY  1993   bone spur lt foot    There were no vitals filed for this visit.      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   Currently in Pain? Yes   Pain Score 9    Pain Location Knee   Pain Orientation Left;Posterior;Lateral   Pain Descriptors / Indicators Throbbing   Aggravating Factors  going up and down steps repeatedly   Pain Relieving Factors ice                         [facility] Adult PT Treatment/Exercise - 07/02/16 0001      Self-Care   Other Self-Care Comments  Advised patient  to avoid stairs if she can and to use a cold pack on her knee after the ionto patch has been removed.     Knee/Hip Exercises: Seated   Other Seated Knee/Hip Exercises hamstring stretch, 30 seconds each side     Knee/Hip Exercises: Supine   Straight Leg Raises AROM;Right;Left;10 reps     Knee/Hip Exercises: Sidelying   Hip ABduction AROM;Right;Left;10 reps  not easy     Knee/Hip Exercises: Prone   Straight Leg Raises AROM;Right;Left;5 reps  painful on left     Modalities   Modalities Cryotherapy;Iontophoresis     Cryotherapy   Number Minutes Cryotherapy 10 Minutes   Cryotherapy Location Knee  left, posterior, medial and lateral with neck pack   Type of Cryotherapy Ice pack     Iontophoresis   Type of Iontophoresis Dexamethasone   Location left posterolateral knee just inferior to joint line   Dose 1 ml. in STAT patch   Time 6 hour wear time                        [facility] Goals - 07/02/16 2035      CC Long Term Goal  #1   Title left  knee pain will be reduced by 75%   Status On-going     CC Long Term Goal  #2   Title pt. 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  Exercise was limited today due to this increased soreness.  She has not noted benefit of iontophoresis after first application.   Rehab Potential Good   Clinical Impairments Affecting Rehab Potential none   PT Frequency 2x / week   PT Duration 4 weeks   PT Treatment/Interventions ADLs/Self Care Home Management;Electrical Stimulation;Iontophoresis 4mg /ml Dexamethasone;Moist Heat;Gait training;Stair training;Functional mobility  training;Therapeutic exercise;Patient/family education;Manual techniques;Passive range of motion;Taping;Vasopneumatic Device   PT Next Visit Plan Review HEP as needed; continue hip and knee strengthening to tolerance.  Continue iontophoresis.   PT Home Exercise Plan SLRs in three positions, hamstring stretches   Consulted and Agree with Plan of Care Patient      Patient will benefit from skilled therapeutic intervention in order to improve the following deficits and impairments:  Abnormal gait, Pain, Decreased strength, Decreased mobility  Visit Diagnosis: Left knee pain, unspecified chronicity  Difficulty in walking, not elsewhere classified  Muscle weakness (generalized)     Problem List Patient Active Problem List   Diagnosis Date Noted  . Ductal carcinoma in situ (DCIS) of right breast 06/03/2016    [name],[name] 07/02/2016, 8:36 PM  [place], [place], [address] Phone: [phone]   Fax:  [phone]  Name: Brittany Colon MRN: [id number] Date of Birth: [birth year]   [name], PT 07/02/16 8:36 PM

## 2016-07-07 ENCOUNTER — Ambulatory Visit: Payer: BC Managed Care – PPO | Admitting: Physical Therapy

## 2016-07-07 DIAGNOSIS — M25662 Stiffness of left knee, not elsewhere classified: Secondary | ICD-10-CM

## 2016-07-07 DIAGNOSIS — M25562 Pain in left knee: Secondary | ICD-10-CM | POA: Diagnosis not present

## 2016-07-07 DIAGNOSIS — M6281 Muscle weakness (generalized): Secondary | ICD-10-CM

## 2016-07-07 DIAGNOSIS — R262 Difficulty in walking, not elsewhere classified: Secondary | ICD-10-CM

## 2016-07-07 NOTE — Therapy (Signed)
[place], [place], [address] Phone: [phone]   Fax:  [phone]  Physical Therapy Treatment  Patient Details  Name: Brittany Colon MRN: [id number] Date of Birth: 12/21/1960 Referring Provider: Dr. Lurline Del  Encounter Date: 07/07/2016      PT End of Session - 07/07/16 1706    Visit Number 4   Number of Visits 9   Date for PT Re-Evaluation 07/18/16   PT Start Time 1521   PT Stop Time 1559   PT Time Calculation (min) 38 min   Activity Tolerance Patient tolerated treatment well;Patient limited by pain   Behavior During Therapy [facility] for tasks assessed/performed      Past Medical History:  Diagnosis Date  . Allergy   . Breast cancer ([place]) 12/14/12   right  . Breast cyst 12/18/12   mult bilaterally per MRI  . Headache(784.0)   . History of radiation therapy 02/09/2013-03/31/2013   62.4 gray to the right breast    Past Surgical History:  Procedure Laterality Date  . BREAST LUMPECTOMY WITH NEEDLE LOCALIZATION Right 01/10/2013   Procedure: BREAST LUMPECTOMY WITH NEEDLE LOCALIZATION;  Surgeon: Edward Jolly, MD;  Location: [place];  Service: General;  Laterality: Right;  . CARPAL TUNNEL RELEASE Right   . FOOT SURGERY  1993   bone spur lt foot    There were no vitals filed for this visit.      Subjective Assessment - 07/07/16 1521    Subjective The knee was prickly a couple times over the weekend, but it's better today.  I didn't go up and down the steps at work today. Has been doing the exercises and is not having any problem with those.   Currently in Pain? Yes   Pain Score 5    Pain Location Knee   Pain Orientation Left;Posterior;Lateral   Aggravating Factors  stairs   Pain Relieving Factors ice                         [facility] Adult PT Treatment/Exercise - 07/07/16 0001      Knee/Hip Exercises: Aerobic   Recumbent Bike L1 x 4 mins      Knee/Hip Exercises: Standing   Heel Raises Both;20 reps   Knee Flexion AROM;Right;Left;10 reps;2 sets   Hip Abduction AROM;Right;Left  7 on right, 3 on left (ltd. by discomfort)   Hip Extension AROM;Right;Left  10 reps Rt., 4 x Lt., limited by a cramp starting   Forward Step Up Right;Left;Hand Hold: 1;Step Height: 6"  10 reps with right, only 5 with left due to pain   Step Down Right;Left;Hand Hold: 1;Step Height: 2"  10 times on right, just 3x on left due to knee pain   Other Standing Knee Exercises knee flexion and extension control vs. red Theraband x10 each on left, then green Theraband x 10 each way with left leg   Other Standing Knee Exercises mini wall squats x 10     Iontophoresis   Type of Iontophoresis Dexamethasone   Location left posterolateral knee just inferior to joint line   Dose 1 ml. in STAT patch   Time 6 hour wear time                PT Education - 07/07/16 1604    Education provided Yes   Education Details okay to try at home some of the exercises we did in clinic today, those that  she remembers   Person(s) Educated Patient   Methods Explanation   Comprehension Verbalized understanding                [facility] Goals - 07/02/16 2035      CC Long Term Goal  #1   Title left knee pain will be reduced by 75%   Status On-going     CC Long Term Goal  #2   Title pt. will be knowledgeable about HEP for left knee and hip strengthening, and about self-care for avoiding aggravating knee pain   Status On-going            Plan - 07/07/16 1550    Clinical Impression Statement Reported no knee pain but had hip fatigue after exercise.  She was limited in her ability to do a number of exercises because they produced left knee pain, and I told her not to do anything that aggravated.  She was certainly doing much better today than last session, when her pain was increased from negotiating steps a lot.   Rehab Potential Good   Clinical  Impairments Affecting Rehab Potential none   PT Frequency 2x / week   PT Duration 4 weeks   PT Treatment/Interventions ADLs/Self Care Home Management;Electrical Stimulation;Iontophoresis 4mg /ml Dexamethasone;Moist Heat;Gait training;Stair training;Functional mobility training;Therapeutic exercise;Patient/family education;Manual techniques;Passive range of motion;Taping;Vasopneumatic Device   PT Next Visit Plan Assess goals.  Add to HEP; continue hip and knee strengthening to tolerance.  Continue iontophoresis.   PT Home Exercise Plan SLRs in three positions, hamstring stretches   Consulted and Agree with Plan of Care Patient      Patient will benefit from skilled therapeutic intervention in order to improve the following deficits and impairments:  Abnormal gait, Pain, Decreased strength, Decreased mobility  Visit Diagnosis: Left knee pain, unspecified chronicity  Difficulty in walking, not elsewhere classified  Muscle weakness (generalized)  Stiffness of left knee, not elsewhere classified     Problem List Patient Active Problem List   Diagnosis Date Noted  . 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

## 2016-07-09 ENCOUNTER — Ambulatory Visit: Payer: BC Managed Care – PPO | Admitting: Physical Therapy

## 2016-07-09 DIAGNOSIS — M25662 Stiffness of left knee, not elsewhere classified: Secondary | ICD-10-CM

## 2016-07-09 DIAGNOSIS — M25562 Pain in left knee: Secondary | ICD-10-CM

## 2016-07-09 DIAGNOSIS — R262 Difficulty in walking, not elsewhere classified: Secondary | ICD-10-CM

## 2016-07-09 DIAGNOSIS — M6281 Muscle weakness (generalized): Secondary | ICD-10-CM

## 2016-07-09 NOTE — Patient Instructions (Signed)
Heel Slides    Squeeze pelvic floor and hold. Slide left heel along bed towards bottom. Hold for _1__ seconds. Slide back to flat knee position. Repeat _10__ times. Do __1_ times a day. Repeat with other leg. Don't do this if it hurts!   Copyright  VHI. All rights reserved.  Bridging    Slowly raise buttocks from floor, keeping stomach tight.  Keep your weight even on both feet. Repeat __10__ times per set. Do __1-2__ sets per session. Do __1__ sessions per day.  http://orth.exer.us/1097   Copyright  VHI. All rights reserved.

## 2016-07-09 NOTE — Therapy (Signed)
[place], [place], [address] Phone: [phone]   Fax:  [phone]  Physical Therapy Treatment  Patient Details  Name: Brittany Colon MRN: [id number] Date of Birth: Nov 12, 1960 Referring Provider: Dr. Lurline Del  Encounter Date: 07/09/2016      PT End of Session - 07/09/16 1715    Visit Number 5   Number of Visits 9   Date for PT Re-Evaluation 07/18/16   PT Start Time 0165   PT Stop Time 1515   PT Time Calculation (min) 42 min   Behavior During Therapy [facility] for tasks assessed/performed      Past Medical History:  Diagnosis Date  . Allergy   . Breast cancer ([place]) 12/14/12   right  . Breast cyst 12/18/12   mult bilaterally per MRI  . Headache(784.0)   . History of radiation therapy 02/09/2013-03/31/2013   62.4 gray to the right breast    Past Surgical History:  Procedure Laterality Date  . BREAST LUMPECTOMY WITH NEEDLE LOCALIZATION Right 01/10/2013   Procedure: BREAST LUMPECTOMY WITH NEEDLE LOCALIZATION;  Surgeon: Edward Jolly, MD;  Location: [place];  Service: General;  Laterality: Right;  . CARPAL TUNNEL RELEASE Right   . FOOT SURGERY  1993   bone spur lt foot    There were no vitals filed for this visit.      Subjective Assessment - 07/09/16 1435    Subjective "The knee is better today; yesterday was about an eight because I had to go up and down the steps."  Paid more attention to avoiding steps today.  No problems from last session.   Currently in Pain? Yes   Pain Score 4    Pain Location Knee   Pain Orientation Left;Posterior;Lateral   Aggravating Factors  stairs   Pain Relieving Factors not doing the stairs                         [facility] Adult PT Treatment/Exercise - 07/09/16 0001      Knee/Hip Exercises: Aerobic   Recumbent Bike L1 x 5 mins     Knee/Hip Exercises: Standing   Heel Raises Both  30 reps   Knee Flexion  Strengthening;Right;Left;10 reps  0#, then 1# each side x 10 each   Lateral Step Up Right;10 reps;Hand Hold: 0;Step Height: 4"  5 reps on left, then reported burning feeling   Forward Step Up Right;Left;10 reps;Hand Hold: 0;Step Height: 4"  no pain with 4" step today   Other Standing Knee Exercises knee flexion and extension control vs. green Theraband x 10 each way   Other Standing Knee Exercises mini wall squats x 10     Knee/Hip Exercises: Supine   Heel Slides AROM;Right;Left;10 reps  no pain   Single Leg Bridge --  unable   Other Supine Knee/Hip Exercises bridges x 10     Iontophoresis   Type of Iontophoresis Dexamethasone   Location left posterolateral knee just inferior to joint line   Dose 1 ml. in STAT patch   Time 6 hour wear time     Manual Therapy   Manual Therapy Other (comment)   Other Manual Therapy soft tissue mobilization just medial to tender spot at left posterolateral knee; pt. has ropy tightness there--stretched that manually                PT Education - 07/09/16 1715    Education provided Yes  Education Details add heel slides and bridges to HEP   Methods Explanation;Verbal cues;Handout   Comprehension Verbalized understanding;Returned demonstration                [place] Goals - 07/09/16 1719      CC Long Term Goal  #1   Title left knee pain will be reduced by 75%   Status On-going     CC Long Term Goal  #2   Title pt. will be knowledgeable about HEP for left knee and hip strengthening, and about self-care for avoiding aggravating knee pain   Status Partially Met     CC Long Term Goal  #3   Title Patient will be able to stand after sitting a few minutes with normal body mechanic   Status On-going     CC Long Term Goal  #4   Title Pt. will demonstrate gait with minimal to no deviation related to left knee pain   Status On-going            Plan - 07/09/16 1515    Clinical Impression Statement Doing better  today, but patient's pain seems to depend on how many times she negotiates stairs at work, so it is difficult to determine whether therapy is helping.  She was able to do more exercise this week, though we have had to be selective to make sure not to do any that cause pain.  May switch from iontophoresis to ice massage.   Rehab Potential Good   Clinical Impairments Affecting Rehab Potential none   PT Frequency 2x / week   PT Duration 4 weeks   PT Treatment/Interventions ADLs/Self Care Home Management;Electrical Stimulation;Iontophoresis 4m/ml Dexamethasone;Moist Heat;Gait training;Stair training;Functional mobility training;Therapeutic exercise;Patient/family education;Manual techniques;Passive range of motion;Taping;Vasopneumatic Device   PT Next Visit Plan Assess goals. Continue some soft tissue work/stretching/cross friction just medial to tenderness at posterolateral knee where there is a ropy area of tightness; consider ice massage; continue other treatments. Pt. has some velcro and dumbbell weights at home; she is going to check on the weight of the velcro ones for possible use with HEP.   PT Home Exercise Plan SLRs in three positions, hamstring stretches, bridges, heel slides   Consulted and Agree with Plan of Care Patient      Patient will benefit from skilled therapeutic intervention in order to improve the following deficits and impairments:  Abnormal gait, Pain, Decreased strength, Decreased mobility  Visit Diagnosis: Left knee pain, unspecified chronicity  Difficulty in walking, not elsewhere classified  Muscle weakness (generalized)  Stiffness of left knee, not elsewhere classified     Problem List Patient Active Problem List   Diagnosis Date Noted  . 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

## 2016-07-11 ENCOUNTER — Ambulatory Visit: Payer: BC Managed Care – PPO | Admitting: Physical Therapy

## 2016-07-14 ENCOUNTER — Ambulatory Visit: Payer: BC Managed Care – PPO | Admitting: Physical Therapy

## 2016-07-14 DIAGNOSIS — M6281 Muscle weakness (generalized): Secondary | ICD-10-CM

## 2016-07-14 DIAGNOSIS — R262 Difficulty in walking, not elsewhere classified: Secondary | ICD-10-CM

## 2016-07-14 DIAGNOSIS — M25562 Pain in left knee: Secondary | ICD-10-CM | POA: Diagnosis not present

## 2016-07-14 NOTE — Therapy (Signed)
[place], [place], [address] Phone: [phone]   Fax:  [phone]  Physical Therapy Treatment  Patient Details  Name: Brittany Colon MRN: [id number] Date of Birth: [birth year] Referring Provider: Dr. Lurline Del  Encounter Date: 07/14/2016      PT End of Session - 07/14/16 1650    Visit Number 6   Number of Visits 9   Date for PT Re-Evaluation 07/18/16   PT Start Time 1600   PT Stop Time 1645   PT Time Calculation (min) 45 min   Activity Tolerance Patient tolerated treatment well   Behavior During Therapy [facility] for tasks assessed/performed      Past Medical History:  Diagnosis Date  . Allergy   . Breast cancer ([place]) 12/14/12   right  . Breast cyst 12/18/12   mult bilaterally per MRI  . Headache(784.0)   . History of radiation therapy 02/09/2013-03/31/2013   62.4 gray to the right breast    Past Surgical History:  Procedure Laterality Date  . BREAST LUMPECTOMY WITH NEEDLE LOCALIZATION Right 01/10/2013   Procedure: BREAST LUMPECTOMY WITH NEEDLE LOCALIZATION;  Surgeon: Edward Jolly, MD;  Location: [place];  Service: General;  Laterality: Right;  . CARPAL TUNNEL RELEASE Right   . FOOT SURGERY  1993   bone spur lt foot    There were no vitals filed for this visit.      Subjective Assessment - 07/14/16 1601    Subjective The knee was some better this weekend and this morning when she got up she thought it was a lot better, but it has gotten worse with her activity today.  Did the ice a lot more this weekend and that helped.   Currently in Pain? Yes   Pain Score 6    Pain Location Knee   Pain Orientation Left;Posterior;Lateral   Aggravating Factors  being up on the leg at work   Pain Relieving Factors keeping weight off the left                         [facility] Adult PT Treatment/Exercise - 07/14/16 0001      Knee/Hip Exercises: Aerobic   Recumbent Bike L1 x 6 mins     Knee/Hip Exercises: Standing   Heel Raises Both  30 reps   Knee Flexion Strengthening;Both;10 reps;2 sets  2 lbs.   Hip Abduction Stengthening;Right;Left;10 reps;Knee straight  1 lb. weight   Hip Extension Stengthening;Right;Left;10 reps;Knee straight  1 lb.   Lateral Step Up Right;10 reps;Step Height: 4";Hand Hold: 0  only 3x to left due to pain   Forward Step Up Right;Left;10 reps;Hand Hold: 1;Step Height: 4"   SLS 30 seconds on right, 25 seconds on left   Other Standing Knee Exercises partial wall squats x 10     Knee/Hip Exercises: Supine   Bridges Limitations bridging with feet up on small physioball x 10     Iontophoresis   Type of Iontophoresis Dexamethasone   Location left posterolateral knee just inferior to joint line   Dose 1 ml. in STAT patch   Time 6 hour wear time     Manual Therapy   Manual Therapy Passive ROM   Passive ROM left hamstring stretch manually in supine x 1 minute hold   Other Manual Therapy cross friction massage to ropey-feeling area at left posterolateral knee; soft tissue mobilization to same  [facility] Goals - 07/14/16 1603      CC Long Term Goal  #1   Title left knee pain will be reduced by 75%   Baseline Maybe 50-60% better as of 07/14/16.   Status Partially Met     CC Long Term Goal  #2   Title pt. will be knowledgeable about HEP for left knee and hip strengthening, and about self-care for avoiding aggravating knee pain   Status Partially Met     CC Long Term Goal  #3   Title Patient will be able to stand after sitting a few minutes with normal body mechanic   Status On-going     CC Long Term Goal  #4   Title Pt. will demonstrate gait with minimal to no deviation related to left knee pain   Status On-going            Plan - 07/14/16 1650    Clinical Impression Statement Although patient continues to report left knee pain, she is having some better  days and reports 50-60% improvement to date.  She is more aware and is avoiding aggravating activities when she can.  She has been able to increase her exercise here.   Rehab Potential Good   PT Frequency 2x / week   PT Duration 4 weeks   PT Treatment/Interventions ADLs/Self Care Home Management;Electrical Stimulation;Iontophoresis 71m/ml Dexamethasone;Moist Heat;Gait training;Stair training;Functional mobility training;Therapeutic exercise;Patient/family education;Manual techniques;Passive range of motion;Taping;Vasopneumatic Device   PT Next Visit Plan Patient returns tomorrow, which would be here 6th ionto treatment.  If that is not done, do ice massage instead.  Continue progressive knee strengthening without pain.   PT Home Exercise Plan SLRs in three positions, hamstring stretches, bridges, heel slides   Consulted and Agree with Plan of Care Patient      Patient will benefit from skilled therapeutic intervention in order to improve the following deficits and impairments:  Abnormal gait, Pain, Decreased strength, Decreased mobility  Visit Diagnosis: Left knee pain, unspecified chronicity  Difficulty in walking, not elsewhere classified  Muscle weakness (generalized)     Problem List Patient Active Problem List   Diagnosis Date Noted  . 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

## 2016-07-15 ENCOUNTER — Ambulatory Visit: Payer: BC Managed Care – PPO | Admitting: Physical Therapy

## 2016-07-15 DIAGNOSIS — M25562 Pain in left knee: Secondary | ICD-10-CM

## 2016-07-15 DIAGNOSIS — R262 Difficulty in walking, not elsewhere classified: Secondary | ICD-10-CM

## 2016-07-15 DIAGNOSIS — M6281 Muscle weakness (generalized): Secondary | ICD-10-CM

## 2016-07-15 NOTE — Therapy (Signed)
.[place], [place], [address] Phone: [phone]   Fax:  [phone]  Physical Therapy Treatment  Patient Details  Name: Brittany Colon MRN: [id number] Date of Birth: [birth year] Referring Provider: Dr. Lurline Del  Encounter Date: 07/15/2016      PT End of Session - 07/15/16 1605    Visit Number 7   Number of Visits 9   Date for PT Re-Evaluation 07/18/16   PT Start Time 9480  pt. late today   PT Stop Time 1601   PT Time Calculation (min) 35 min   Activity Tolerance Patient tolerated treatment well   Behavior During Therapy [facility] for tasks assessed/performed      Past Medical History:  Diagnosis Date  . Allergy   . Breast cancer ([place]) 12/14/12   right  . Breast cyst 12/18/12   mult bilaterally per MRI  . Headache(784.0)   . History of radiation therapy 02/09/2013-03/31/2013   62.4 gray to the right breast    Past Surgical History:  Procedure Laterality Date  . BREAST LUMPECTOMY WITH NEEDLE LOCALIZATION Right 01/10/2013   Procedure: BREAST LUMPECTOMY WITH NEEDLE LOCALIZATION;  Surgeon: Edward Jolly, MD;  Location: [place];  Service: General;  Laterality: Right;  . CARPAL TUNNEL RELEASE Right   . FOOT SURGERY  1993   bone spur lt foot    There were no vitals filed for this visit.      Subjective Assessment - 07/15/16 1527    Subjective Everything is basically the same.   Currently in Pain? Yes   Pain Score 6    Pain Location Knee   Pain Orientation Left;Posterior;Lateral   Aggravating Factors  having a lot to do today, including some steps and carrying something heavy                         [facility] Adult PT Treatment/Exercise - 07/15/16 0001      Knee/Hip Exercises: Aerobic   Recumbent Bike L1 x 7 mins  no pain, but a challenge to do this     Knee/Hip Exercises: Standing   Heel Raises Both  30 reps   Knee Flexion  Strengthening;Right;Left;10 reps  3 lbs.   Lateral Step Up Right;10 reps;Hand Hold: 0;Step Height: 4";Left  no pain left today, and able to do 10 reps!!   Forward Step Up Right;Left;10 reps;Hand Hold: 0;Step Height: 4"     Knee/Hip Exercises: Seated   Sit to Sand 10 reps;with UE support  from elevated mat (higher than standard seat height)     Knee/Hip Exercises: Supine   Bridges Limitations bridging with feet up on small physioball x 10     Iontophoresis   Location left posterolateral knee just inferior to joint line   Dose 1 ml. in STAT patch   Time 6 hour wear time     Manual Therapy   Passive ROM left hamstring stretch manually in supine x 1 minute hold each with hip rotated internally and externally   Other Manual Therapy cross friction massage to ropey-feeling area at left posterolateral knee; soft tissue mobilization to same                        [place] [facility] Goals - 07/14/16 1603      CC Long Term Goal  #1   Title left knee pain will be reduced by 75%   Baseline Maybe  50-60% better as of 07/14/16.   Status Partially Met     CC Long Term Goal  #2   Title pt. will be knowledgeable about HEP for left knee and hip strengthening, and about self-care for avoiding aggravating knee pain   Status Partially Met     CC Long Term Goal  #3   Title Patient will be able to stand after sitting a few minutes with normal body mechanic   Status On-going     CC Long Term Goal  #4   Title Pt. will demonstrate gait with minimal to no deviation related to left knee pain   Status On-going            Plan - 07/15/16 1605    Clinical Impression Statement Patient continues to have some pain complaints, made worse when her activity level increases, particularly at work.  However, she has been able to consistently increase her exercise resistance and repetitions in therapy and does report less pain overall.  She completed her sixth treatment of iontoporesis today.    Rehab Potential Good   Clinical Impairments Affecting Rehab Potential none   PT Frequency 2x / week   PT Duration 4 weeks   PT Treatment/Interventions ADLs/Self Care Home Management;Electrical Stimulation;Iontophoresis 49m/ml Dexamethasone;Moist Heat;Gait training;Stair training;Functional mobility training;Therapeutic exercise;Patient/family education;Manual techniques;Passive range of motion;Taping;Vasopneumatic Device   PT Next Visit Plan Will need a renewal next visit.  Switch from iontophoresis to trial of ice massage to focal area of pain at posterolateral left knee.  Continue progressive exercise without pain.  Add to HEP.   PT Home Exercise Plan SLRs in three positions, hamstring stretches, bridges, heel slides   Consulted and Agree with Plan of Care Patient      Patient will benefit from skilled therapeutic intervention in order to improve the following deficits and impairments:  Abnormal gait, Pain, Decreased strength, Decreased mobility  Visit Diagnosis: Left knee pain, unspecified chronicity  Difficulty in walking, not elsewhere classified  Muscle weakness (generalized)     Problem List Patient Active Problem List   Diagnosis Date Noted  . 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

## 2016-07-16 ENCOUNTER — Ambulatory Visit: Payer: BC Managed Care – PPO

## 2016-07-21 ENCOUNTER — Other Ambulatory Visit: Payer: Self-pay

## 2016-07-21 ENCOUNTER — Ambulatory Visit: Payer: BC Managed Care – PPO | Admitting: Physical Therapy

## 2016-07-21 DIAGNOSIS — C50111 Malignant neoplasm of central portion of right female breast: Secondary | ICD-10-CM

## 2016-07-21 MED ORDER — TAMOXIFEN CITRATE 20 MG PO TABS: ORAL_TABLET | ORAL | 3 refills | Status: DC

## 2016-07-23 ENCOUNTER — Ambulatory Visit: Payer: BC Managed Care – PPO | Admitting: Physical Therapy

## 2016-07-23 DIAGNOSIS — M6281 Muscle weakness (generalized): Secondary | ICD-10-CM

## 2016-07-23 DIAGNOSIS — R262 Difficulty in walking, not elsewhere classified: Secondary | ICD-10-CM

## 2016-07-23 DIAGNOSIS — M25562 Pain in left knee: Secondary | ICD-10-CM | POA: Diagnosis not present

## 2016-07-23 DIAGNOSIS — M25662 Stiffness of left knee, not elsewhere classified: Secondary | ICD-10-CM

## 2016-07-23 NOTE — Therapy (Signed)
[place], [place], [address] Phone: [phone]   Fax:  [phone]  Physical Therapy Evaluation  Patient Details  Name: MARYKATE HEUBERGER MRN: [id number] Date of Birth: 05-31-1961 Referring Provider: Dr. Lurline Del  Encounter Date: 07/23/2016      PT End of Session - 07/23/16 1440    Visit Number 8   Number of Visits 16   Date for PT Re-Evaluation 08/24/16   PT Start Time 2778   PT Stop Time 1432   PT Time Calculation (min) 38 min   Activity Tolerance Patient tolerated treatment well   Behavior During Therapy [facility] for tasks assessed/performed      Past Medical History:  Diagnosis Date  . Allergy   . Breast cancer ([place]) 12/14/12   right  . Breast cyst 12/18/12   mult bilaterally per MRI  . Headache(784.0)   . History of radiation therapy 02/09/2013-03/31/2013   62.4 gray to the right breast    Past Surgical History:  Procedure Laterality Date  . BREAST LUMPECTOMY WITH NEEDLE LOCALIZATION Right 01/10/2013   Procedure: BREAST LUMPECTOMY WITH NEEDLE LOCALIZATION;  Surgeon: Edward Jolly, MD;  Location: [place];  Service: General;  Laterality: Right;  . CARPAL TUNNEL RELEASE Right   . FOOT SURGERY  1993   bone spur lt foot    There were no vitals filed for this visit.       Subjective Assessment - 07/23/16 1356    Subjective Rested it over the Thanksgiving weekend and she's trying to stay away from the stairs, but it's hurting off and on today.    Currently in Pain? Yes   Pain Score 6    Pain Location Knee   Pain Orientation Left;Posterior;Lateral   Pain Descriptors / Indicators Dull   Pain Frequency Intermittent   Aggravating Factors  turning her foot wrong, wakes her sometimes in the night   Pain Relieving Factors ice                       [facility] Adult PT Treatment/Exercise - 07/23/16 0001      Ambulation/Gait   Gait Comments checked  gait after session:  still slightly slow and with a tendency to be stiff; she reports pain at lateral aspect of left knee (not posterolateral) after ice massage     Knee/Hip Exercises: Aerobic   Recumbent Bike L1 x 8 mins.     Knee/Hip Exercises: Supine   Short Arc Quad Sets Strengthening;Right;Left;10 reps  3 lbs.   Heel Slides AROM;Right;Left  12 reps   Bridges Limitations plain bridges x 10   Single Leg Bridge Right;Left;5 sets   Straight Leg Raises AROM;Right;Left  12 reps     Knee/Hip Exercises: Sidelying   Hip ABduction AROM;Right;Left  12 reps     Knee/Hip Exercises: Prone   Hamstring Curl 10 reps;1 second;2 sets  3 lbs. each on right and left   Hip Extension AROM;Right;Left  12 reps   Straight Leg Raises AROM;Right;Left  12 reps     Cryotherapy   Number Minutes Cryotherapy 4 Minutes   Cryotherapy Location Knee  posterolateral   Type of Cryotherapy Ice massage                PT Education - 07/23/16 1422    Education provided Yes   Education Details knee flexion in prone with 2-1 lb. ankle weights, 10 reps x 2-3 sets   Person(s) Educated  Patient   Methods Explanation;Handout;Verbal cues   Comprehension Verbalized understanding                [facility] Goals - 07/23/16 1447      CC Long Term Goal  #1   Title left knee pain will be reduced by 75%   Baseline Maybe 50-60% better as of 07/14/16.   Status Partially Met     CC Long Term Goal  #2   Title pt. will be knowledgeable about HEP for left knee and hip strengthening, and about self-care for avoiding aggravating knee pain   Status Partially Met     CC Long Term Goal  #3   Title Patient will be able to stand after sitting a few minutes with normal body mechanic   Status On-going     CC Long Term Goal  #4   Title Pt. will demonstrate gait with minimal to no deviation related to left knee pain   Status On-going            Plan - 07/23/16 1443    Clinical Impression  Statement Patient still with ups and downs in her pain. She had several good days over the weekend, but pain is flaring up again today.  Gait is still affected at times.  She did tolerate all of the exercises that at I had her do today without any pain in left knee.  Ice massage helped in the posterolateral aspect of knee where it was applied, but she reported lateral knee pain when walking after that.    Rehab Potential Good   Clinical Impairments Affecting Rehab Potential none   PT Frequency 2x / week   PT Duration 4 weeks   PT Treatment/Interventions ADLs/Self Care Home Management;Electrical Stimulation;Iontophoresis 34m/ml Dexamethasone;Moist Heat;Gait training;Stair training;Functional mobility training;Therapeutic exercise;Patient/family education;Manual techniques;Passive range of motion;Taping;Vasopneumatic Device   PT Next Visit Plan Renewal sent today, but plan to discuss with patient if she wants to continue therapy.  Continue stationary bike and progressive resistance hip and knee exercise; continue ice massage, but extend it from posterior knee at lateral aspect to include lateral knee at posterior aspect.   PT Home Exercise Plan add prone knee flexion with 2 lb. weights   Consulted and Agree with Plan of Care Patient      Patient will benefit from skilled therapeutic intervention in order to improve the following deficits and impairments:  Abnormal gait, Pain, Decreased strength, Decreased mobility  Visit Diagnosis: Left knee pain, unspecified chronicity - Plan: PT plan of care cert/re-cert  Difficulty in walking, not elsewhere classified - Plan: PT plan of care cert/re-cert  Muscle weakness (generalized) - Plan: PT plan of care cert/re-cert  Stiffness of left knee, not elsewhere classified - Plan: PT plan of care cert/re-cert     Problem List Patient Active Problem List   Diagnosis Date Noted  . Ductal carcinoma in situ (DCIS) of right breast 06/03/2016     Jushua Waltman 07/23/2016, 2:50 PM  [place] [address]Phone: [phone]  Fax:  [phone] Name: EJULIANNY MILSTEINMRN: [id number] of Birth: [birth year] DSerafina Royals PT 07/23/16 2:50 PM

## 2016-07-23 NOTE — Patient Instructions (Signed)
Okay to do knee bending on your stomach with 2-1 lb. weights on your ankle, 10 reps x 2-3 sets.

## 2016-07-25 ENCOUNTER — Encounter: Payer: Self-pay | Admitting: Physical Therapy

## 2016-07-25 ENCOUNTER — Ambulatory Visit: Payer: BC Managed Care – PPO | Attending: Oncology | Admitting: Physical Therapy

## 2016-07-25 DIAGNOSIS — M6281 Muscle weakness (generalized): Secondary | ICD-10-CM

## 2016-07-25 DIAGNOSIS — M25662 Stiffness of left knee, not elsewhere classified: Secondary | ICD-10-CM | POA: Diagnosis present

## 2016-07-25 DIAGNOSIS — R262 Difficulty in walking, not elsewhere classified: Secondary | ICD-10-CM | POA: Insufficient documentation

## 2016-07-25 DIAGNOSIS — M25562 Pain in left knee: Secondary | ICD-10-CM | POA: Diagnosis present

## 2016-07-25 NOTE — Therapy (Addendum)
[place], [place], [address] Phone: [phone]   Fax:  [phone]  Physical Therapy Treatment  Patient Details  Name: Brittany Colon MRN: [id number] Date of Birth: 1960-11-02 Referring Provider: Dr. Lurline Del  Encounter Date: 07/25/2016      PT End of Session - 07/25/16 1101    Visit Number 9   Number of Visits 16   Date for PT Re-Evaluation 08/24/16   PT Start Time 0932   PT Stop Time 1015   PT Time Calculation (min) 43 min   Activity Tolerance Patient tolerated treatment well   Behavior During Therapy [facility] for tasks assessed/performed      Past Medical History:  Diagnosis Date  . Allergy   . Breast cancer ([place]) 12/14/12   right  . Breast cyst 12/18/12   mult bilaterally per MRI  . Headache(784.0)   . History of radiation therapy 02/09/2013-03/31/2013   62.4 gray to the right breast    Past Surgical History:  Procedure Laterality Date  . BREAST LUMPECTOMY WITH NEEDLE LOCALIZATION Right 01/10/2013   Procedure: BREAST LUMPECTOMY WITH NEEDLE LOCALIZATION;  Surgeon: Edward Jolly, MD;  Location: [facility];  Service: General;  Laterality: Right;  . CARPAL TUNNEL RELEASE Right   . FOOT SURGERY  1993   bone spur lt foot    There were no vitals filed for this visit.      Subjective Assessment - 07/25/16 0937    Subjective My knee is better today. It is always better when I first get up. Through the night I was having a lot of pain in the muscles in the back of my leg.  The pain is more frequent especially at night.    Pertinent History Left knee pain for six months.  Right knee had bothered her and primary care doctor took an x-ray over a year ago, which showed some arthritis, but the left knee is really the problem now.  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  Sees Dr. Jana Hakim once a year and  recently saw him for follow-up and mentioned this knee pain.   Patient Stated Goals "I want my knee not to hurt, to be able to really do my exercise so I can lose some weight."  Wants to be able to squat down without having the pain.  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   Currently in Pain? No/denies   Pain Score 0-No pain                         [facility] Adult PT Treatment/Exercise - 07/25/16 0001      Knee/Hip Exercises: Aerobic   Recumbent Bike L1 x 8 mins.     Knee/Hip Exercises: Supine   Short Arc Quad Sets Strengthening;Right;Left;10 reps  3 lbs.   Heel Slides AROM;Right;Left  12 reps   Bridges Limitations plain bridges x 10   Single Leg Bridge Right;Left;5 reps   Straight Leg Raises AROM;Right;Left  12 reps     Knee/Hip Exercises: Sidelying   Hip ABduction AROM;Right;Left  12 reps     Knee/Hip Exercises: Prone   Hamstring Curl 10 reps;1 second;2 sets  3 lbs. each on right and left   Hip Extension AROM;Right;Left  12 reps     Cryotherapy  Number Minutes Cryotherapy 7 Minutes   Cryotherapy Location Knee  lateral and posterior knee   Type of Cryotherapy Ice massage                        [facility] Goals - 07/23/16 1447      CC Long Term Goal  #1   Title left knee pain will be reduced by 75%   Baseline Maybe 50-60% better as of 07/14/16.   Status Partially Met     CC Long Term Goal  #2   Title pt. will be knowledgeable about HEP for left knee and hip strengthening, and about self-care for avoiding aggravating knee pain   Status Partially Met     CC Long Term Goal  #3   Title Patient will be able to stand after sitting a few minutes with normal body mechanic   Status On-going     CC Long Term Goal  #4   Title Pt. will demonstrate gait with minimal to no deviation related to left knee pain   Status On-going            Plan - 07/25/16  1204    Clinical Impression Statement Patient reports she is still having a lot of knee pain and it is more frequently as night than at time of evaluation. Patient states she would like to see her doctor about this and hopefully get some imaging done to find out what is going on with her knee. At this point she wants to be placed on hold until she follows up with her doctor.    Rehab Potential Good   Clinical Impairments Affecting Rehab Potential none   PT Frequency 2x / week   PT Duration 4 weeks   PT Treatment/Interventions ADLs/Self Care Home Management;Electrical Stimulation;Iontophoresis 74m/ml Dexamethasone;Moist Heat;Gait training;Stair training;Functional mobility training;Therapeutic exercise;Patient/family education;Manual techniques;Passive range of motion;Taping;Vasopneumatic Device   PT Next Visit Plan Pt currently on hold awaiting dr appt for knee pain: Continue stationary bike and progressive resistance hip and knee exercise; continue ice massage, but extend it from posterior knee at lateral aspect to include lateral knee at posterior aspect.   PT Home Exercise Plan add prone knee flexion with 2 lb. weights   Consulted and Agree with Plan of Care Patient      Patient will benefit from skilled therapeutic intervention in order to improve the following deficits and impairments:  Abnormal gait, Pain, Decreased strength, Decreased mobility  Visit Diagnosis: Left knee pain, unspecified chronicity  Difficulty in walking, not elsewhere classified  Muscle weakness (generalized)  Stiffness of left knee, not elsewhere classified     Problem List Patient Active Problem List   Diagnosis Date Noted  . Ductal carcinoma in situ (DCIS) of right breast 06/03/2016    BAlexia Freestone12/08/2015, 12:09 PM  [place] [address]Phone: [phone]  Fax:  [phone] Name: Brittany PUNGMRN: [id number] of Birth: [birth year]  BAllyson Colon PT 07/25/16 12:09 PM  PHYSICAL THERAPY DISCHARGE SUMMARY  Visits from Start of Care: 9  Current functional level related to goals / functional outcomes: See above   Remaining deficits: See above   Education / Equipment: See above  Plan: Patient agrees to discharge.  Patient goals were partially met. Patient is being discharged due to not returning since the last visit.  ?????     BManus Gunning PT  09/18/17 11:45 AM

## 2016-09-22 ENCOUNTER — Other Ambulatory Visit: Payer: Self-pay | Admitting: Oncology

## 2016-09-22 DIAGNOSIS — Z853 Personal history of malignant neoplasm of breast: Secondary | ICD-10-CM

## 2016-10-01 ENCOUNTER — Ambulatory Visit
Admission: RE | Admit: 2016-10-01 | Discharge: 2016-10-01 | Disposition: A | Discharge status: Home / Self Care | Payer: BC Managed Care – PPO | Source: Ambulatory Visit | Attending: Oncology | Admitting: Oncology

## 2016-10-01 DIAGNOSIS — Z853 Personal history of malignant neoplasm of breast: Secondary | ICD-10-CM

## 2016-10-01 HISTORY — DX: Personal history of irradiation: Z92.3

## 2017-04-16 ENCOUNTER — Other Ambulatory Visit: Payer: Self-pay | Admitting: Pain Medicine

## 2017-04-16 DIAGNOSIS — M79605 Pain in left leg: Secondary | ICD-10-CM

## 2017-04-24 ENCOUNTER — Ambulatory Visit
Admission: RE | Admit: 2017-04-24 | Discharge: 2017-04-24 | Disposition: A | Discharge status: Home / Self Care | Payer: BC Managed Care – PPO | Source: Ambulatory Visit | Attending: Pain Medicine | Admitting: Pain Medicine

## 2017-04-24 DIAGNOSIS — M79605 Pain in left leg: Secondary | ICD-10-CM

## 2017-04-24 MED ORDER — GADOBENATE DIMEGLUMINE 529 MG/ML IV SOLN
17.0000 mL | Freq: Once | INTRAVENOUS | Status: AC | PRN
  Administered 2017-04-24: 17 mL via INTRAVENOUS

## 2017-06-02 NOTE — Progress Notes (Signed)
ID: Brittany Colon   DOB: 01/18/1961  MR#: [id number]  NOM#:[id number]  PCP: Brittany Sheller, MD GYN: Brittany Colon SU: Excell Seltzer OTHER MD: Brittany Colon  CHIEF COMPLAINT: Ductal carcinoma in situ  CURRENT TREATMENT: Tamoxifen  BREAST CANCER HISTORY: From the original intake note:  "Brittany Colon" has lumpy breasts, and has had diagnostic mammography previously. Screening mammography 08/14/2011 suggested a possible change in the right breast, and diagnostic right breast mammography 09/09/2011 found a cluster of coarse calcifications superiorly in the right breast. These were felt to be likely benign, and six-month followup was recommended. However the next mammogram was 12/14/2012. This showed the calcifications in question to have increased since the prior study. The covered an area of 7 mm. 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  Bilateral breast MRI 12/18/2012 showed only post biopsy change and multiple breast cysts bilaterally. The patient's subsequent history is as detailed below  INTERVAL HISTORY: Brittany Colon returns for follow up of her ductal carcinoma in situ. She continues on tamoxifen, which she tolerates well. She notes mild hot flashes and leg cramps without increased vaginal wetness.   REVIEW OF SYSTEMS: Brittany Colon reports bilateral knee pain that is being followed by [facility] in [facility]. She has been unable to ambulate for her exercise due to her bilateral knee pain. This has been evaluated by MRI,, which is summarized below. She has a history of palpitations and occasional headaches, which is no different from baseline. A detailed review of systems today was otherwise stable.    PAST MEDICAL HISTORY: Past Medical History:  Diagnosis Date  . Allergy   . Breast cancer ([place]) 12/14/12   right  . Breast cyst 12/18/12   mult bilaterally per MRI  . Headache(784.0)   .  History of radiation therapy 02/09/2013-03/31/2013   62.4 gray to the right breast  . Personal history of radiation therapy 2014    PAST SURGICAL HISTORY: Past Surgical History:  Procedure Laterality Date  . BREAST BIOPSY Right 2014  . BREAST LUMPECTOMY WITH NEEDLE LOCALIZATION Right 01/10/2013   Procedure: BREAST LUMPECTOMY WITH NEEDLE LOCALIZATION;  Surgeon: Brittany Jolly, MD;  Location: [facility];  Service: General;  Laterality: Right;  . CARPAL TUNNEL RELEASE Right   . FOOT SURGERY  1993   bone spur lt foot    FAMILY HISTORY Family History  Problem Relation Age of Onset  . Heart disease Mother    The patient's father died from Parkinson's disease at age 65. The patient's mother died from a myocardial infarction at age 2. Lives has 3 brothers, one sister. There is no history of cancer in the family to her knowledge.   GYNECOLOGIC HISTORY: Menarche age 82, first live birth age 41. She is GX P2. She still having regular periods. She never took birth control pills.   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 Her husband Brittany Colon is disabled secondary to sleep apnea and fibromyalgia. Daughter Brittany Colon works as a Psychologist, sport and exercise at [employer] son Brittany Colon graduated with an Engineer, production from [employer]. The patient has 1 grandchild who is 8 years old. She attends a [employer] in [place] DIRECTIVES:Not in place   HEALTH MAINTENANCE: Social History  Substance Use Topics  . Smoking status: Never Smoker  .  Smokeless tobacco: Not on file  . Alcohol use No     Colonoscopy:  PAP:  Bone density:  Lipid panel:  Allergies  Allergen Reactions  . Sulfur     vomiting    Current Outpatient Prescriptions  Medication Sig Dispense Refill  . Glucosamine HCl (GLUCOSAMINE PO) Take 1  tablet by mouth daily.    . Melaton-Thean-Cham-PassF-LBalm (MELATONIN + L-THEANINE PO) Take 1 capsule by mouth at bedtime as needed.    . Multiple Vitamins-Minerals (MULTIVITAMIN PO) Take by mouth daily.    . tamoxifen (NOLVADEX) 20 MG tablet TAKE 1 TABLET (20 MG TOTAL) BY MOUTH DAILY. 90 tablet 3   No current facility-administered medications for this visit.     OBJECTIVE: Middle-aged [name] who appears well  Vitals:   06/03/17 1451  BP: 132/67  Pulse: 83  Resp: 18  Temp: 98.2 F (36.8 C)  SpO2: 98%     Body mass index is 34.46 kg/m.    ECOG FS: 0  Sclerae unicteric, EOMs intact Oropharynx clear and moist No cervical or supraclavicular adenopathy Lungs no rales or rhonchi Heart regular rate and rhythm Abd soft, nontender, positive bowel sounds MSK no focal spinal tenderness, no upper extremity lymphedema Neuro: nonfocal, well oriented, appropriate affect Breasts: The right breast has undergone lumpectomy followed by radiation with no evidence of local recurrence. The left breast is benign. Both axillae are benign.  LAB RESULTS: Lab Results  Component Value Date   WBC 5.6 05/20/2015   NEUTROABS 2.5 05/20/2015   HGB 13.3 05/20/2015   HCT 39.0 05/20/2015   MCV 91.2 05/20/2015   PLT 186 05/20/2015      Chemistry      Component Value Date/Time   NA 140 05/20/2015 1719   NA 141 12/22/2012 1219   K 4.1 05/20/2015 1719   K 4.1 12/22/2012 1219   CL 105 05/20/2015 1719   CL 107 12/22/2012 1219   CO2 24 05/20/2015 1707   CO2 28 12/22/2012 1219   BUN 12 05/20/2015 1719   BUN 14.3 12/22/2012 1219   CREATININE 0.70 05/20/2015 1719   CREATININE 0.8 12/22/2012 1219      Component Value Date/Time   CALCIUM 8.8 (L) 05/20/2015 1707   CALCIUM 9.2 12/22/2012 1219   ALKPHOS 41 05/20/2015 1707   ALKPHOS 61 12/22/2012 1219   AST 22 05/20/2015 1707   AST 16 12/22/2012 1219   ALT 14 05/20/2015 1707   ALT 11 12/22/2012 1219   BILITOT 0.5 05/20/2015 1707    BILITOT 0.28 12/22/2012 1219       No results found for: LABCA2  No components found for: LABCA125  No results for input(s): INR in the last 168 hours.  Urinalysis    Component Value Date/Time   LABSPEC 1.020 11/21/2008 1036   PHURINE 6.5 11/21/2008 1036   GLUCOSEU NEGATIVE 11/21/2008 1036   HGBUR NEGATIVE 11/21/2008 1036   BILIRUBINUR NEGATIVE 11/21/2008 1036   KETONESUR NEGATIVE 11/21/2008 1036   PROTEINUR NEGATIVE 11/21/2008 1036   UROBILINOGEN 0.2 11/21/2008 1036   NITRITE NEGATIVE 11/21/2008 1036   LEUKOCYTESUR  11/21/2008 1036    NEGATIVE Biochemical Testing Only. Please order routine urinalysis from main lab if confirmatory testing is needed.    STUDIES: She had bilateral mammography with tomography at the [facility] 10/01/2016, showing the breast density to be category C. There was no evidence of malignancy.  Also on 04/24/2017 for evaluation of left sided posterior calf pain she had an MRI of the lower  left extremity which showed moderate control doses of the medial femorotibial compartment of both knees, but no soft tissue mass or fluid collection.   ASSESSMENT: 56 y.o. [place] woman status post right breast upper outer quadrant lumpectomy 01/10/2013 for a high-grade ductal carcinoma in situ, 100% estrogen receptor positive, 100% progesterone receptor positive.  (1) adjuvant radiation  completed 03/31/2013  (2) tamoxifen started Sept 2014   PLAN: Brittany Colon Is now 4-1/2 years out from definitive surgery for noninvasive breast cancer, with no evidence of disease activity. This is very favorable.  She is tolerating tamoxifen well and the plan is to continue that through August of next year, after which point she will "graduate".  Her exercise capacity is limited now because of her knee problems. I think she would benefit from joining a gym so she could do water aerobics, water walking, and perhaps a stationary bike.  [facility] [employer]  [facility] could [facility]. Of course we also have a tai chi and yoga available here  Otherwise she will see me again in August of next year. She knows to call for any problems that may develop before that visit.  Chauncey Cruel, MD 06/03/17 3:03 PM Medical Oncology and Hematology [facility] [address] [place], [place] [address] Tel. [phone] Fax. [phone]   This document serves as a record of services personally performed by Lurline Del, MD. It was created on her behalf by Steva Colder, a trained medical scribe. The creation of this record is based on the scribe's personal observations and the provider's statements to them. This document has been checked and approved by the attending provider.

## 2017-06-03 ENCOUNTER — Ambulatory Visit (HOSPITAL_BASED_OUTPATIENT_CLINIC_OR_DEPARTMENT_OTHER): Payer: BC Managed Care – PPO | Admitting: Oncology

## 2017-06-03 ENCOUNTER — Ambulatory Visit: Payer: BC Managed Care – PPO | Admitting: Oncology

## 2017-06-03 VITALS — BP 132/67 | HR 83 | Temp 98.2°F | Resp 18 | Ht 62.0 in | Wt 188.4 lb

## 2017-06-03 DIAGNOSIS — Z86000 Personal history of in-situ neoplasm of breast: Secondary | ICD-10-CM

## 2017-06-03 DIAGNOSIS — Z79810 Long term (current) use of selective estrogen receptor modulators (SERMs): Secondary | ICD-10-CM | POA: Diagnosis not present

## 2017-06-03 DIAGNOSIS — D0511 Intraductal carcinoma in situ of right breast: Secondary | ICD-10-CM

## 2017-06-10 ENCOUNTER — Telehealth: Payer: Self-pay | Admitting: Oncology

## 2017-06-10 NOTE — Telephone Encounter (Signed)
Spoke with patient regarding added appt per 10/10 los.

## 2017-07-10 ENCOUNTER — Other Ambulatory Visit: Payer: Self-pay

## 2017-07-10 DIAGNOSIS — C50111 Malignant neoplasm of central portion of right female breast: Secondary | ICD-10-CM

## 2017-07-10 MED ORDER — TAMOXIFEN CITRATE 20 MG PO TABS: ORAL_TABLET | ORAL | 3 refills | Status: DC

## 2017-09-03 ENCOUNTER — Other Ambulatory Visit: Payer: Self-pay | Admitting: Oncology

## 2017-09-03 DIAGNOSIS — Z9889 Other specified postprocedural states: Secondary | ICD-10-CM

## 2017-10-02 ENCOUNTER — Ambulatory Visit
Admission: RE | Admit: 2017-10-02 | Discharge: 2017-10-02 | Disposition: A | Discharge status: Home / Self Care | Payer: BC Managed Care – PPO | Source: Ambulatory Visit | Attending: Oncology | Admitting: Oncology

## 2017-10-02 DIAGNOSIS — Z9889 Other specified postprocedural states: Secondary | ICD-10-CM

## 2018-04-02 NOTE — Progress Notes (Signed)
ID: Brittany Colon   DOB: [birth year]  MR#: [id number]  LEX#:[id number]  PCP: Thressa Sheller, MD GYN: Bobbye Charleston SU: Excell Seltzer OTHER MD: Gery Pray  CHIEF COMPLAINT: Ductal carcinoma in situ  CURRENT TREATMENT: Completing 5 years of tamoxifen tamoxifen  BREAST CANCER HISTORY: From the original intake note:  "Brittany Colon" has lumpy breasts, and has had diagnostic mammography previously. Screening mammography 08/14/2011 suggested a possible change in the right breast, and diagnostic right breast mammography 09/09/2011 found a cluster of coarse calcifications superiorly in the right breast. These were felt to be likely benign, and six-month followup was recommended. However the next mammogram was 12/14/2012. This showed the calcifications in question to have increased since the prior study. The covered an area of 7 mm. 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  Bilateral breast MRI 12/18/2012 showed only post biopsy change and multiple breast cysts bilaterally. The patient's subsequent history is as detailed below  INTERVAL HISTORY: Brittany Colon returns for follow up of her ductal carcinoma in situ. She is completing 5 years of tamoxifen. She has hot flashes that are not intense. She denies issues with increased vaginal discharge. She is hoping that her period does not return.   She underwent diagnostic bilateral mammography with CAD and tomography on 10/02/2017 at [facility] showing: breast density category C. There was no evidence of malignancy.    REVIEW OF SYSTEMS: Brittany Colon reports that she had partial left knee replacement about 6 weeks ago and she plans to have the right done in October 2019 under Dr. Percell Miller. She takes tylenol as needed for pain. She denies unusual headaches, visual changes, nausea, vomiting, or dizziness. There has been no unusual cough, phlegm production, or  pleurisy. There's been no change in bowel or bladder habits. She denies unexplained fatigue or unexplained weight loss, bleeding, rash, or fever. A detailed review of systems was otherwise stable.     PAST MEDICAL HISTORY: Past Medical History:  Diagnosis Date  . Allergy   . Breast cancer ([place]) 12/14/12   right  . Breast cyst 12/18/12   mult bilaterally per MRI  . Headache(784.0)   . History of radiation therapy 02/09/2013-03/31/2013   62.4 gray to the right breast  . Personal history of radiation therapy 2014   Right Breast Cancer    PAST SURGICAL HISTORY: Past Surgical History:  Procedure Laterality Date  . BREAST BIOPSY Right 2014  . BREAST LUMPECTOMY Right 2014  . BREAST LUMPECTOMY WITH NEEDLE LOCALIZATION Right 01/10/2013   Procedure: BREAST LUMPECTOMY WITH NEEDLE LOCALIZATION;  Surgeon: Edward Jolly, MD;  Location: [facility];  Service: General;  Laterality: Right;  . CARPAL TUNNEL RELEASE Right   . FOOT SURGERY  1993   bone spur lt foot    FAMILY HISTORY Family History  Problem Relation Age of Onset  . Heart disease Mother    The patient's father died from Parkinson's disease at age 64. The patient's mother died from a myocardial infarction at age 29. Lives has 3 brothers, one sister. There is no history of cancer in the family to her knowledge.   GYNECOLOGIC HISTORY: Menarche age 32, first live birth age 62. She is GX P2. She still having regular periods. She never took birth control pills.   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 Her husband Brittany Colon is disabled secondary to sleep apnea and fibromyalgia. Daughter Brittany Colon works as a Psychologist, sport and exercise at [employer] son Brittany Colon graduated with an Engineer, production from [employer]. The patient has 1 grandchild who is 64 years old. She  attends a [employer] in [place] DIRECTIVES:Not in place   HEALTH MAINTENANCE: Social History   Tobacco Use  . Smoking status: Never Smoker  Substance Use Topics  . Alcohol use: No  . Drug use: No     Colonoscopy:  PAP:  Bone density:  Lipid panel:  Allergies  Allergen Reactions  . Sulfur     vomiting    Current Outpatient Medications  Medication Sig Dispense Refill  . Glucosamine HCl (GLUCOSAMINE PO) Take 1 tablet by mouth daily.    . Melaton-Thean-Cham-PassF-LBalm (MELATONIN + L-THEANINE PO) Take 1 capsule by mouth at bedtime as needed.    . Multiple Vitamins-Minerals (MULTIVITAMIN PO) Take by mouth daily.    . tamoxifen (NOLVADEX) 20 MG tablet TAKE 1 TABLET (20 MG TOTAL) BY MOUTH DAILY. 90 tablet 3   No current facility-administered medications for this visit.     OBJECTIVE: Middle-aged [name] walking with a cane  Vitals:   04/05/18 1317  BP: 124/68  Pulse: 84  Resp: 19  Temp: 98.3 F (36.8 C)  SpO2: 99%     Body mass index is 34.18 kg/m.    ECOG FS: 2  Sclerae unicteric, pupils round and equal No cervical or supraclavicular adenopathy Lungs no rales or rhonchi Heart regular rate and rhythm Abd soft, nontender, positive bowel sounds MSK no focal spinal tenderness, no upper extremity lymphedema Neuro: nonfocal, well oriented, appropriate affect Breasts: Right breast is status post lumpectomy and radiation.  There is no evidence of local recurrence.  The left breast is benign.  Both axillae are benign.   LAB RESULTS: Lab Results  Component Value Date   WBC 5.6 05/20/2015   NEUTROABS 2.5 05/20/2015   HGB 13.3 05/20/2015   HCT 39.0 05/20/2015   MCV 91.2 05/20/2015   PLT 186 05/20/2015      Chemistry      Component Value Date/Time   NA 140 05/20/2015 1719   NA 141 12/22/2012 1219   K 4.1 05/20/2015 1719   K 4.1 12/22/2012 1219   CL 105 05/20/2015 1719   CL 107 12/22/2012 1219   CO2 24 05/20/2015 1707   CO2 28  12/22/2012 1219   BUN 12 05/20/2015 1719   BUN 14.3 12/22/2012 1219   CREATININE 0.70 05/20/2015 1719   CREATININE 0.8 12/22/2012 1219      Component Value Date/Time   CALCIUM 8.8 (L) 05/20/2015 1707   CALCIUM 9.2 12/22/2012 1219   ALKPHOS 41 05/20/2015 1707   ALKPHOS 61 12/22/2012 1219   AST 22 05/20/2015 1707   AST 16 12/22/2012 1219   ALT 14 05/20/2015 1707   ALT 11 12/22/2012 1219   BILITOT 0.5 05/20/2015 1707   BILITOT 0.28 12/22/2012 1219       No results found for: LABCA2  No components found for: LABCA125  No results for input(s): INR in the last 168 hours.  Urinalysis    Component Value Date/Time   LABSPEC 1.020 11/21/2008 1036   PHURINE 6.5 11/21/2008 1036   GLUCOSEU NEGATIVE 11/21/2008 1036   HGBUR NEGATIVE 11/21/2008 1036   BILIRUBINUR NEGATIVE 11/21/2008 [place] NEGATIVE 11/21/2008 1036   PROTEINUR NEGATIVE 11/21/2008 1036  UROBILINOGEN 0.2 11/21/2008 1036   NITRITE NEGATIVE 11/21/2008 1036   LEUKOCYTESUR  11/21/2008 1036    NEGATIVE Biochemical Testing Only. Please order routine urinalysis from main lab if confirmatory testing is needed.    STUDIES: No results found.  She underwent diagnostic bilateral mammography with CAD and tomography on 10/02/2017 at [facility] showing: breast density category C. There was no evidence of malignancy.    ASSESSMENT: 57 y.o.  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  (1) adjuvant radiation  completed 03/31/2013  (2) tamoxifen started Sept 2014, completing 5 years August 2019   PLAN: Brittany Colon is now more than 5 years out from definitive surgery for her noninvasive breast cancer.  There is no evidence of disease recurrence.  This is very favorable.  She has completed 5 years of tamoxifen.  5 years is all we have data for and noninvasive disease.   Accordingly he is going off the medication at this point  She is worried her periods might come back.  That is possible but unlikely.  She is hoping her hot flashes might disappear.  That is also possible but unlikely  All she needs in terms of breast cancer follow-up is yearly mammography preferably with tomography since her breasts are still a bit dense; and a yearly physician breast exam which she usually gets through Dr. Philis Pique  I will be glad to see her at any point in the future if and when the need arises but as of now are making no further routine appointments for her here.   Magrinat, Virgie Dad, MD  04/05/18 1:38 PM Medical Oncology and Hematology [facility] [address] [place], [place] [address] Tel. [phone]    Fax. [phone]  Alice Rieger, am acting as scribe for Chauncey Cruel MD.  I, Lurline Del MD, have reviewed the above documentation for accuracy and completeness, and I agree with the above.

## 2018-04-05 ENCOUNTER — Inpatient Hospital Stay: Payer: BC Managed Care – PPO | Attending: Oncology | Admitting: Oncology

## 2018-04-05 VITALS — BP 124/68 | HR 84 | Temp 98.3°F | Resp 19 | Ht 62.0 in | Wt 186.9 lb

## 2018-04-05 DIAGNOSIS — Z170 Estrogen receptor positive status [ER+]: Secondary | ICD-10-CM | POA: Diagnosis not present

## 2018-04-05 DIAGNOSIS — Z79899 Other long term (current) drug therapy: Secondary | ICD-10-CM | POA: Insufficient documentation

## 2018-04-05 DIAGNOSIS — Z9223 Personal history of estrogen therapy: Secondary | ICD-10-CM | POA: Diagnosis not present

## 2018-04-05 DIAGNOSIS — Z923 Personal history of irradiation: Secondary | ICD-10-CM | POA: Diagnosis not present

## 2018-04-05 DIAGNOSIS — R232 Flushing: Secondary | ICD-10-CM | POA: Insufficient documentation

## 2018-04-05 DIAGNOSIS — Z86000 Personal history of in-situ neoplasm of breast: Secondary | ICD-10-CM

## 2018-04-05 DIAGNOSIS — D0511 Intraductal carcinoma in situ of right breast: Secondary | ICD-10-CM

## 2018-04-07 ENCOUNTER — Telehealth: Payer: Self-pay | Admitting: Oncology

## 2018-04-07 NOTE — Telephone Encounter (Signed)
Per 8/12 no los °

## 2018-07-07 ENCOUNTER — Encounter (HOSPITAL_COMMUNITY): Payer: Self-pay

## 2018-07-07 ENCOUNTER — Ambulatory Visit (HOSPITAL_COMMUNITY)
Admission: EM | Admit: 2018-07-07 | Discharge: 2018-07-07 | Disposition: A | Discharge status: Home / Self Care | Payer: BC Managed Care – PPO | Attending: Internal Medicine | Admitting: Internal Medicine

## 2018-07-07 ENCOUNTER — Ambulatory Visit (INDEPENDENT_AMBULATORY_CARE_PROVIDER_SITE_OTHER): Payer: BC Managed Care – PPO

## 2018-07-07 ENCOUNTER — Other Ambulatory Visit: Payer: Self-pay

## 2018-07-07 DIAGNOSIS — K5900 Constipation, unspecified: Secondary | ICD-10-CM | POA: Diagnosis not present

## 2018-07-07 DIAGNOSIS — T50905A Adverse effect of unspecified drugs, medicaments and biological substances, initial encounter: Secondary | ICD-10-CM

## 2018-07-07 DIAGNOSIS — G44039 Episodic paroxysmal hemicrania, not intractable: Secondary | ICD-10-CM

## 2018-07-07 MED ORDER — ONDANSETRON HCL 8 MG PO TABS: 8.0000 mg | ORAL_TABLET | Freq: Three times a day (TID) | ORAL | 0 refills | Status: AC | PRN

## 2018-07-07 MED ORDER — KETOROLAC TROMETHAMINE 60 MG/2ML IM SOLN
60.0000 mg | Freq: Once | INTRAMUSCULAR | Status: AC
  Administered 2018-07-07: 60 mg via INTRAMUSCULAR

## 2018-07-07 MED ORDER — ACETAMINOPHEN 500 MG PO TABS
1000.0000 mg | ORAL_TABLET | Freq: Once | ORAL | Status: AC
  Administered 2018-07-07: 975 mg via ORAL

## 2018-07-07 MED ORDER — ONDANSETRON 4 MG PO TBDP
ORAL_TABLET | ORAL | Status: AC
Start: 2018-07-07 — End: 2018-07-07
  Filled 2018-07-07: qty 1

## 2018-07-07 MED ORDER — ACETAMINOPHEN 325 MG PO TABS
ORAL_TABLET | ORAL | Status: AC
  Filled 2018-07-07: qty 3

## 2018-07-07 MED ORDER — ONDANSETRON 4 MG PO TBDP
4.0000 mg | ORAL_TABLET | Freq: Once | ORAL | Status: AC
  Administered 2018-07-07: 4 mg via ORAL

## 2018-07-07 MED ORDER — KETOROLAC TROMETHAMINE 60 MG/2ML IM SOLN
INTRAMUSCULAR | Status: AC
Start: 2018-07-07 — End: 2018-07-07
  Filled 2018-07-07: qty 2

## 2018-07-07 NOTE — Discharge Instructions (Addendum)
Take 3 doses of Miralax at ones tonight and if no bowel movement, then take 3 doses in the am again. To prevent this, increase fiber intake, water and take Miralax  17 G daily Stop the antibiotic, and you should improve in 24- 48h.  If you get worse tonight, go to ER Follow up with your family Dr on Friday.

## 2018-07-07 NOTE — ED Provider Notes (Signed)
[facility]    CSN: [id number] Arrival date & time: 07/07/18  1900     History   Chief Complaint Chief Complaint  Patient presents with  . Headache  . Nausea    HPI Brittany Colon is a 57 y.o. female.   Onset of HA on R forehead to temple x 1 week. She saw PCP last week and placed on antibiotics for bladder infection x 7 days, and the pressure resolved. She has one more day left. Was placed on Macrobid which is around the time her HA and nausea started. She tried Excedrin migraine yesterday but has not helped. She also had rash on her R scalp last week, which is now gone, except has a couple of crusted areas.  She described her HA as aching, and off and on sharp, pain 9/10. Provoked with palpation of scalp. Nothing has helped. Denies photophobia or sonophobia. Has not had any URI or allergy symptoms in the past week.      Past Medical History:  Diagnosis Date  . Allergy   . Breast cancer ([place]) 12/14/12   right  . Breast cyst 12/18/12   mult bilaterally per MRI  . Headache(784.0)   . History of radiation therapy 02/09/2013-03/31/2013   62.4 gray to the right breast  . Personal history of radiation therapy 2014   Right Breast Cancer    Patient Active Problem List   Diagnosis Date Noted  . Ductal carcinoma in situ (DCIS) of right breast 06/03/2016    Past Surgical History:  Procedure Laterality Date  . BREAST BIOPSY Right 2014  . BREAST LUMPECTOMY Right 2014  . BREAST LUMPECTOMY WITH NEEDLE LOCALIZATION Right 01/10/2013   Procedure: BREAST LUMPECTOMY WITH NEEDLE LOCALIZATION;  Surgeon: Edward Jolly, MD;  Location: [facility];  Service: General;  Laterality: Right;  . CARPAL TUNNEL RELEASE Right   . FOOT SURGERY  1993   bone spur lt foot  . KNEE SURGERY Bilateral     OB History   None      Home Medications    Prior to Admission medications   Medication Sig Start Date End Date Taking? Authorizing Provider  Glucosamine  HCl (GLUCOSAMINE PO) Take 1 tablet by mouth daily.    [provider]  Melaton-Thean-Cham-PassF-LBalm (MELATONIN + L-THEANINE PO) Take 1 capsule by mouth at bedtime as needed.    [provider]  Multiple Vitamins-Minerals (MULTIVITAMIN PO) Take by mouth daily.    [provider]    Family History Family History  Problem Relation Age of Onset  . Heart disease Mother     Social History Social History   Tobacco Use  . Smoking status: Never Smoker  Substance Use Topics  . Alcohol use: No  . Drug use: No     Allergies   Sulfur   Review of Systems Review of Systems  Constitutional: Negative for chills, diaphoresis and fever.  HENT: Negative.  Negative for congestion, dental problem, ear discharge, facial swelling, postnasal drip, rhinorrhea, sinus pressure, sinus pain, sneezing and trouble swallowing.   Eyes: Negative for discharge and visual disturbance.  Respiratory: Negative for cough and shortness of breath.   Cardiovascular: Negative for chest pain.  Gastrointestinal: Positive for abdominal pain, constipation and nausea. Negative for abdominal distention and diarrhea.       Upper abd pain now, had been lower but this area is not hurting since treated for UTI  Genitourinary: Negative for difficulty urinating, dysuria, frequency and urgency.  Musculoskeletal:  Positive for arthralgias and joint swelling.       Bilateral knees are still healing from bilateralTKA during the Summer  Skin: Positive for rash.       Rash on scalp resolved, has a couple of scabs   Neurological: Positive for headaches. Negative for dizziness, tremors, syncope, facial asymmetry, weakness and numbness.   Physical Exam Triage Vital Signs ED Triage Vitals  Enc Vitals Group     BP 07/07/18 1923 123/77     Pulse Rate 07/07/18 1923 99     Resp 07/07/18 1923 18     Temp 07/07/18 1923 98.6 F (37 C)     Temp Source 07/07/18 1923 Oral     SpO2 07/07/18 1923 100 %     Weight  07/07/18 1920 175 lb (79.4 kg)     Height --      Head Circumference --      Peak Flow --      Pain Score 07/07/18 1920 9     Pain Loc --      Pain Edu? --      Excl. in [place]? --    No data found.  Updated Vital Signs BP 123/77 (BP Location: Right Arm)   Pulse 99   Temp 98.6 F (37 C) (Oral)   Resp 18   Wt 175 lb (79.4 kg)   SpO2 100%   BMI 32.01 kg/m   Visual Acuity Right Eye Distance:   Left Eye Distance:   Bilateral Distance:    Right Eye Near:   Left Eye Near:    Bilateral Near:     Physical Exam  Constitutional: She is oriented to person, place, and time. She appears well-developed and well-nourished.  Non-toxic appearance. She does not appear ill. No distress.  HENT:  Head: Normocephalic and atraumatic.  Mouth/Throat: Oropharynx is clear and moist.  R maxillary, ethmoid and frontal sinuses are tender, but even with light touch.  Top scalp shows a couple of yellow crusted scabs, but no redness, hotness or tenderness noted.  Temporal artery pulse +2/5 with no hardening. Has generalized tenderness on R scalp region from R parietal to temporal, to forehead and orbit region.   Eyes: Pupils are equal, round, and reactive to light. EOM are normal. No scleral icterus. Right eye exhibits normal extraocular motion and no nystagmus. Left eye exhibits normal extraocular motion and no nystagmus.  Neck: Neck supple. No neck rigidity.  No neck muscular tenderness  Cardiovascular: Normal rate and normal heart sounds.  No murmur heard. Pulmonary/Chest: Effort normal and breath sounds normal. She has no wheezes.  Abdominal: Soft. Bowel sounds are normal. She exhibits no distension and no mass. There is tenderness. There is no guarding.  Has diffuse upper abdomen tenderness  Musculoskeletal: Normal range of motion.  Lymphadenopathy:    She has no cervical adenopathy.  Neurological: She is alert and oriented to person, place, and time. She has normal strength. She is not  disoriented. She displays no tremor. No cranial nerve deficit or sensory deficit. She exhibits normal muscle tone. She displays a negative Romberg sign. Coordination and gait normal.  Rhomberg and finger to nose test were normal.   Skin: Skin is warm and dry. No rash noted. She is not diaphoretic.  Psychiatric: She has a normal mood and affect. Her behavior is normal. Her mood appears not anxious. She is not agitated.   UC Treatments / Results  Labs (all labs ordered are listed, but only abnormal results are displayed)  Labs Reviewed - No data to display  EKG None  Radiology KUB shows large amount of stool on ascending, transverse and part of descending.   Procedures Procedures   Medications Ordered in UC Medications  ondansetron (ZOFRAN-ODT) disintegrating tablet 4 mg (has no administration in time range)  ketorolac (TORADOL) injection 60 mg (has no administration in time range)  ondansetron (ZOFRAN-ODT) disintegrating tablet 4 mg (4 mg Oral Given 07/07/18 1949)  acetaminophen (TYLENOL) tablet 1,000 mg (975 mg Oral Given 07/07/18 1949)    Initial Impression / Assessment and Plan / UC Course  I have reviewed the triage vital signs and the nursing notes. Pertinent  imaging results that were available during my care of the patient were reviewed by me and considered in my medical decision making (see chart for details). I strongly suspect she has a side effect from the Nitrofurantoin since HA and nausea are the top side effects. And her neuro exam and normal.  She was given Zofran 4 mg  ODT and Tylenol 1000 mg which helped the pain reduce to 8/10 after 20 minutes. The nausea did not change.  She was given Toradol Injection 60 mg IM and another dose of Zofran 4 mg ODT. Advised to d/c the antibiotic and if it is this medication causing her symptoms she will feel better in 24 - 48h. If she gets worse needs to go to ER.  I explained to her and pt's daughter if the rash she had was shingles  this could be post herpetic pain, but is hard to tell since the rash is almost fully resolved.    Final Clinical Impressions(s) / UC Diagnoses   Final diagnoses:  Constipation, unspecified constipation type  Medication side effect, initial encounter     Discharge Instructions     Take 3 doses of Miralax at ones tonight and if no bowel movement, then take 3 doses in the am Stop the antibiotic, and you should improve in 24- 48h.  If you get worse tonight, go to ER Follow up with your family on Friday.     ED Prescriptions    None     Controlled Substance Prescriptions [place] Controlled Substance Registry consulted?    Shelby Mattocks, [place] 07/08/18 [phone]

## 2018-07-07 NOTE — ED Triage Notes (Signed)
Pt has a headache and nausea for 1 week or more.

## 2018-09-02 ENCOUNTER — Other Ambulatory Visit: Payer: Self-pay | Admitting: Oncology

## 2018-09-02 DIAGNOSIS — Z853 Personal history of malignant neoplasm of breast: Secondary | ICD-10-CM

## 2018-09-30 ENCOUNTER — Other Ambulatory Visit: Payer: Self-pay | Admitting: Oncology

## 2018-09-30 DIAGNOSIS — Z1231 Encounter for screening mammogram for malignant neoplasm of breast: Secondary | ICD-10-CM

## 2018-09-30 DIAGNOSIS — Z853 Personal history of malignant neoplasm of breast: Secondary | ICD-10-CM

## 2018-10-01 ENCOUNTER — Other Ambulatory Visit: Payer: Self-pay | Admitting: Adult Health

## 2018-10-01 DIAGNOSIS — Z853 Personal history of malignant neoplasm of breast: Secondary | ICD-10-CM

## 2018-10-04 ENCOUNTER — Other Ambulatory Visit: Payer: Self-pay | Admitting: Adult Health

## 2018-10-04 ENCOUNTER — Ambulatory Visit
Admission: RE | Admit: 2018-10-04 | Discharge: 2018-10-04 | Disposition: A | Discharge status: Home / Self Care | Payer: BC Managed Care – PPO | Source: Ambulatory Visit | Attending: Oncology | Admitting: Oncology

## 2018-10-04 DIAGNOSIS — Z853 Personal history of malignant neoplasm of breast: Secondary | ICD-10-CM

## 2018-10-04 DIAGNOSIS — Z1231 Encounter for screening mammogram for malignant neoplasm of breast: Secondary | ICD-10-CM

## 2018-10-07 ENCOUNTER — Other Ambulatory Visit: Payer: Self-pay | Admitting: Oncology

## 2019-07-05 ENCOUNTER — Other Ambulatory Visit: Payer: Self-pay | Admitting: Obstetrics and Gynecology

## 2019-07-05 DIAGNOSIS — N6320 Unspecified lump in the left breast, unspecified quadrant: Secondary | ICD-10-CM

## 2019-07-11 ENCOUNTER — Other Ambulatory Visit: Payer: Self-pay | Admitting: Obstetrics and Gynecology

## 2019-07-11 ENCOUNTER — Ambulatory Visit
Admission: RE | Admit: 2019-07-11 | Discharge: 2019-07-11 | Disposition: A | Discharge status: Home / Self Care | Payer: BC Managed Care – PPO | Source: Ambulatory Visit | Attending: Obstetrics and Gynecology | Admitting: Obstetrics and Gynecology

## 2019-07-11 ENCOUNTER — Other Ambulatory Visit: Payer: Self-pay

## 2019-07-11 DIAGNOSIS — N6320 Unspecified lump in the left breast, unspecified quadrant: Secondary | ICD-10-CM

## 2019-07-15 ENCOUNTER — Ambulatory Visit
Admission: RE | Admit: 2019-07-15 | Discharge: 2019-07-15 | Disposition: A | Discharge status: Home / Self Care | Payer: BC Managed Care – PPO | Source: Ambulatory Visit | Attending: Obstetrics and Gynecology | Admitting: Obstetrics and Gynecology

## 2019-07-15 ENCOUNTER — Other Ambulatory Visit: Payer: Self-pay | Admitting: Obstetrics and Gynecology

## 2019-07-15 ENCOUNTER — Other Ambulatory Visit: Payer: Self-pay

## 2019-07-15 DIAGNOSIS — N6320 Unspecified lump in the left breast, unspecified quadrant: Secondary | ICD-10-CM

## 2019-09-05 ENCOUNTER — Other Ambulatory Visit: Payer: Self-pay | Admitting: Obstetrics and Gynecology

## 2019-09-05 DIAGNOSIS — Z1231 Encounter for screening mammogram for malignant neoplasm of breast: Secondary | ICD-10-CM

## 2019-10-06 ENCOUNTER — Other Ambulatory Visit: Payer: Self-pay

## 2019-10-06 ENCOUNTER — Ambulatory Visit
Admission: RE | Admit: 2019-10-06 | Discharge: 2019-10-06 | Disposition: A | Discharge status: Home / Self Care | Payer: BC Managed Care – PPO | Source: Ambulatory Visit | Attending: Obstetrics and Gynecology | Admitting: Obstetrics and Gynecology

## 2019-10-06 DIAGNOSIS — Z1231 Encounter for screening mammogram for malignant neoplasm of breast: Secondary | ICD-10-CM

## 2020-09-03 ENCOUNTER — Other Ambulatory Visit: Payer: Self-pay | Admitting: Obstetrics and Gynecology

## 2020-09-03 DIAGNOSIS — Z1231 Encounter for screening mammogram for malignant neoplasm of breast: Secondary | ICD-10-CM

## 2020-10-12 ENCOUNTER — Ambulatory Visit: Payer: BC Managed Care – PPO

## 2020-11-05 ENCOUNTER — Ambulatory Visit
Admission: RE | Admit: 2020-11-05 | Discharge: 2020-11-05 | Disposition: A | Discharge status: Home / Self Care | Payer: BC Managed Care – PPO | Source: Ambulatory Visit | Attending: Obstetrics and Gynecology | Admitting: Obstetrics and Gynecology

## 2020-11-05 ENCOUNTER — Other Ambulatory Visit: Payer: Self-pay

## 2020-11-05 DIAGNOSIS — Z1231 Encounter for screening mammogram for malignant neoplasm of breast: Secondary | ICD-10-CM

## 2021-10-09 ENCOUNTER — Other Ambulatory Visit: Payer: Self-pay | Admitting: Obstetrics and Gynecology

## 2021-10-09 DIAGNOSIS — Z1231 Encounter for screening mammogram for malignant neoplasm of breast: Secondary | ICD-10-CM

## 2021-11-06 ENCOUNTER — Ambulatory Visit
Admission: RE | Admit: 2021-11-06 | Discharge: 2021-11-06 | Disposition: A | Discharge status: Home / Self Care | Payer: BC Managed Care – PPO | Source: Ambulatory Visit | Attending: Obstetrics and Gynecology | Admitting: Obstetrics and Gynecology

## 2021-11-06 DIAGNOSIS — Z1231 Encounter for screening mammogram for malignant neoplasm of breast: Secondary | ICD-10-CM

## 2022-09-14 IMAGING — MG MM DIGITAL SCREENING BILAT W/ TOMO AND CAD
8 series · 8 of 24 positions shown · non-contrast
Comparison: Previous exam(s).

CLINICAL DATA: Screening.

EXAM:
DIGITAL SCREENING BILATERAL MAMMOGRAM WITH TOMOSYNTHESIS AND CAD
TECHNIQUE: Bilateral screening digital craniocaudal and mediolateral oblique
mammograms were obtained. Bilateral screening digital breast
tomosynthesis was performed. The images were evaluated with
computer-aided detection.

[L CC synth-2D]
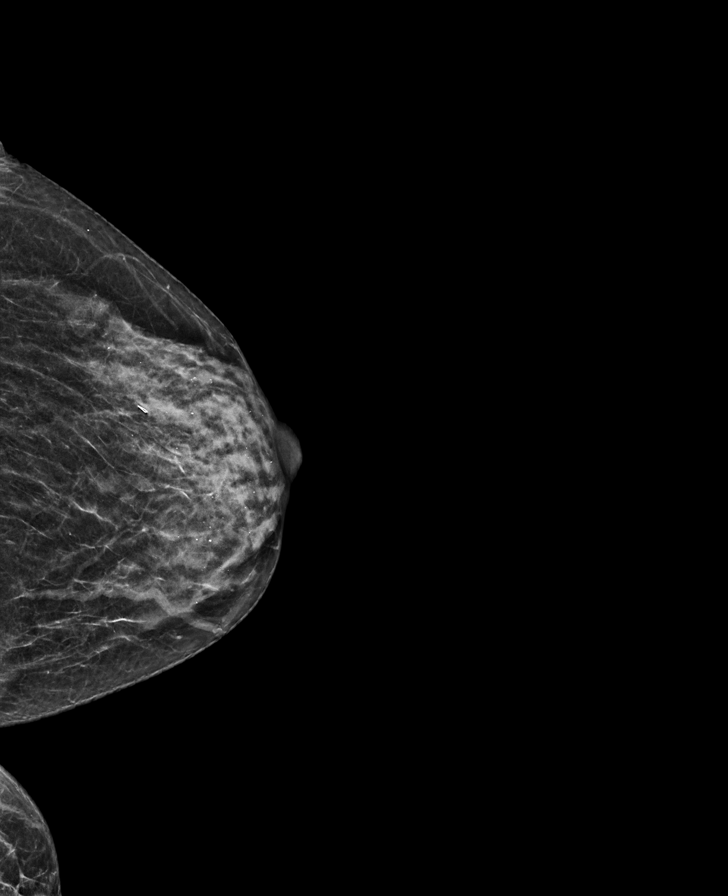

[R MLO synth-2D]
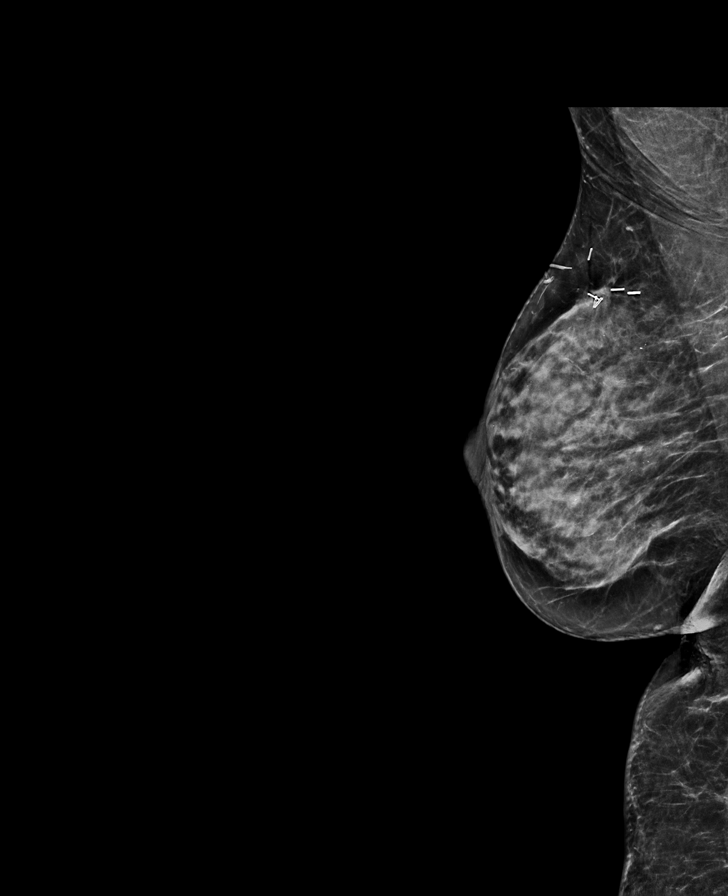

[L MLO synth-2D]
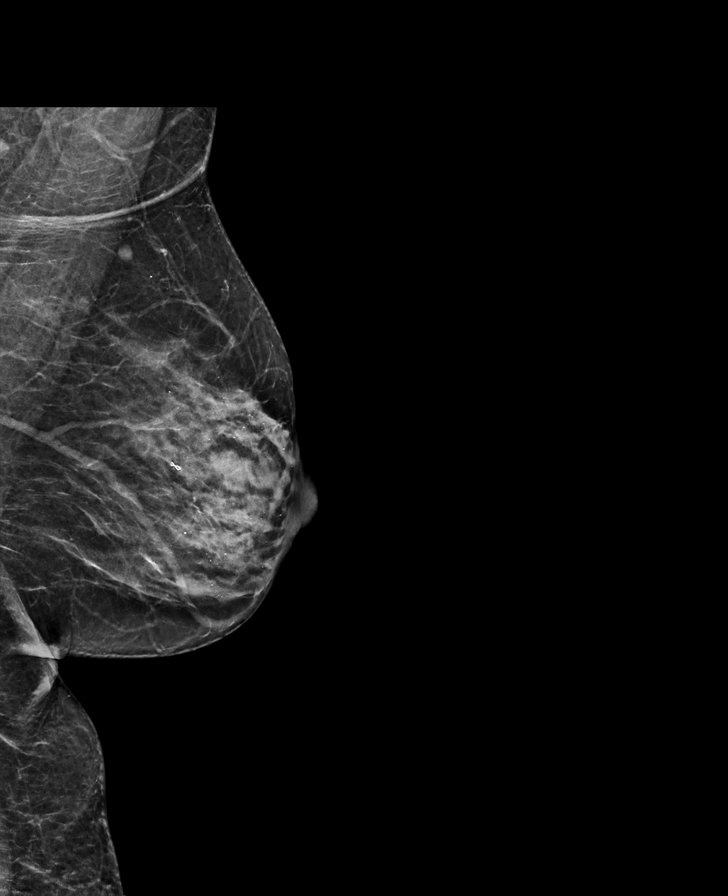

[R CC synth-2D]
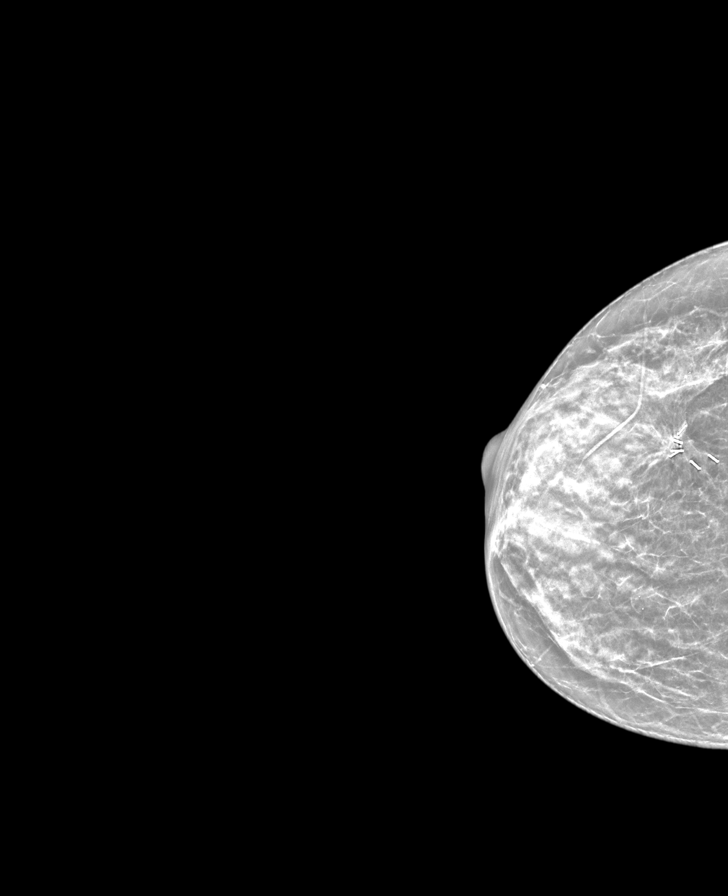

[R MLO tomo · tomo slice 29/57.0]
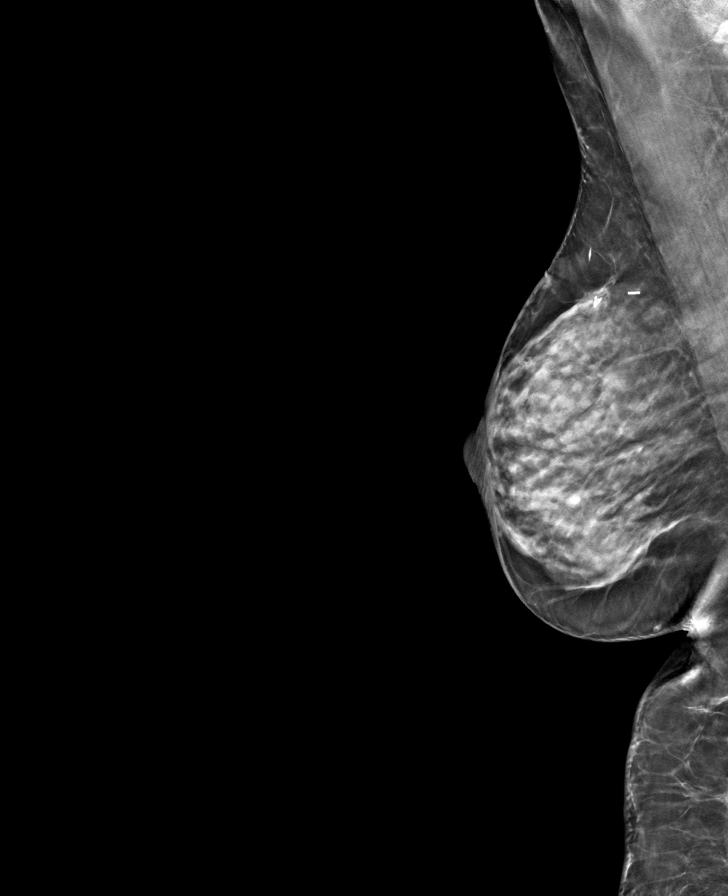

[L MLO tomo · tomo slice 29/56.0]
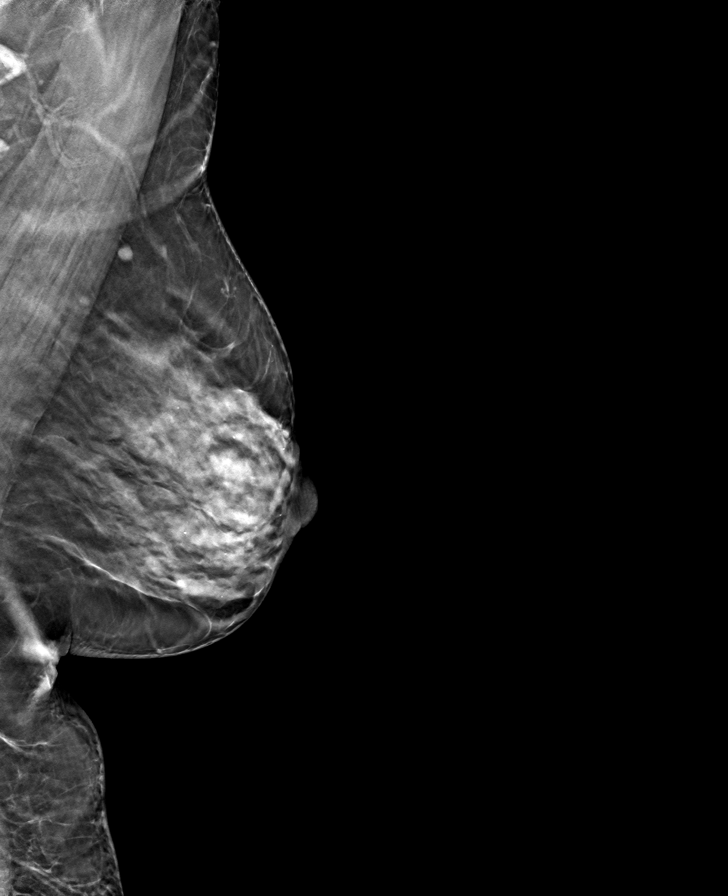

[R CC tomo · tomo slice 25/48.0]
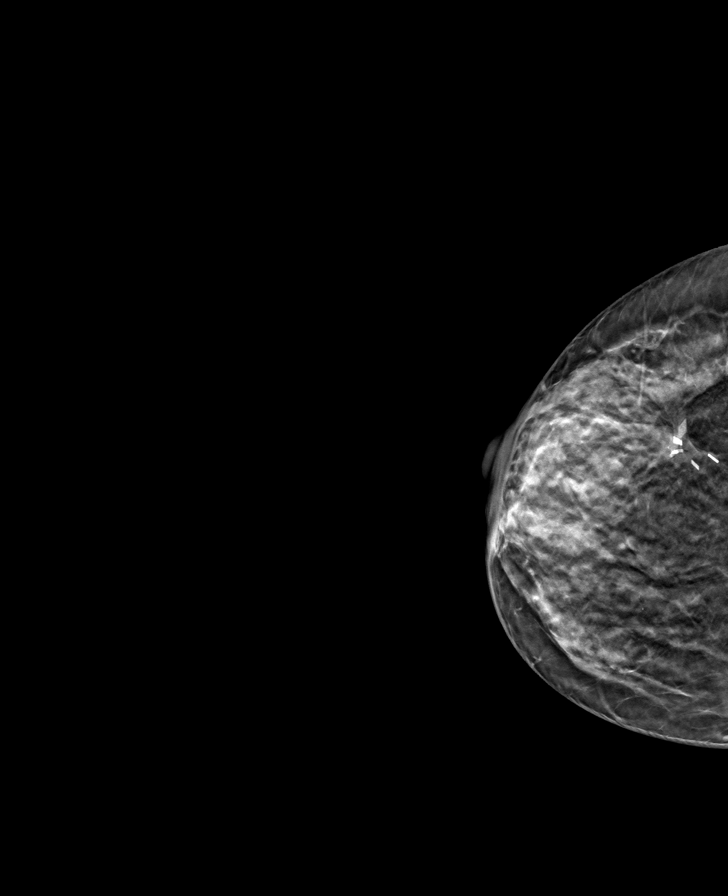

[L CC tomo · tomo slice 23/46.0]
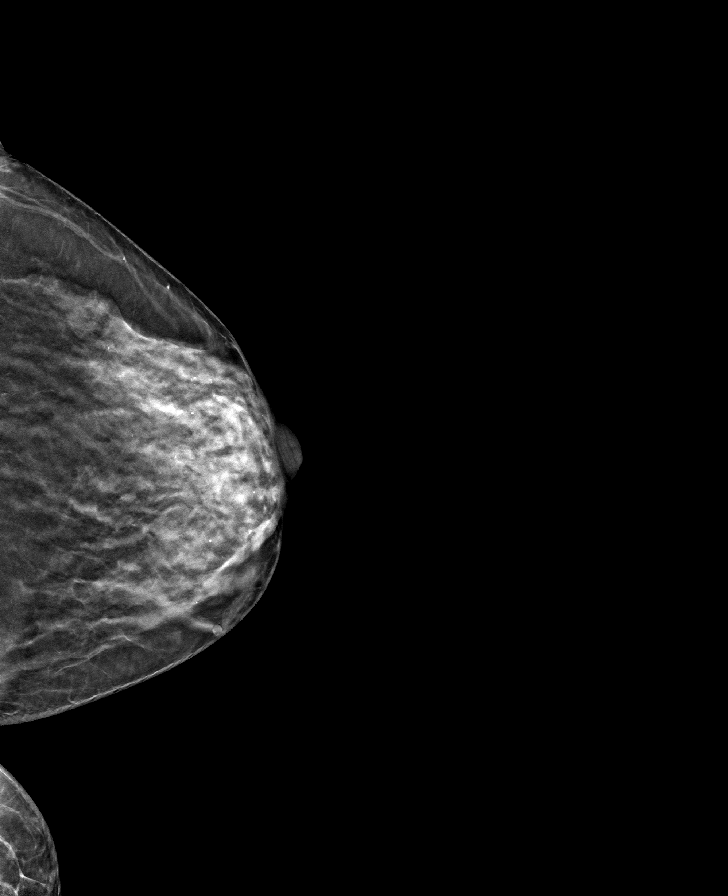

[8 of 24 positions shown; findings below may reference images not displayed]

ACR Breast Density Category c: The breast tissue is heterogeneously
dense, which may obscure small masses.
FINDINGS: There are no findings suspicious for malignancy.
IMPRESSION: No mammographic evidence of malignancy. A result letter of this
screening mammogram will be mailed directly to the patient.

RECOMMENDATION:
Screening mammogram in one year. (Code:Q3-W-BC3)

BI-RADS CATEGORY  1: Negative.

## 2022-10-06 ENCOUNTER — Other Ambulatory Visit: Payer: Self-pay | Admitting: Internal Medicine

## 2022-10-06 DIAGNOSIS — Z1231 Encounter for screening mammogram for malignant neoplasm of breast: Secondary | ICD-10-CM

## 2022-11-24 ENCOUNTER — Ambulatory Visit
Admission: RE | Admit: 2022-11-24 | Discharge: 2022-11-24 | Disposition: A | Discharge status: Home / Self Care | Payer: Medicare Other | Source: Ambulatory Visit | Attending: Internal Medicine | Admitting: Internal Medicine

## 2022-11-24 DIAGNOSIS — Z1231 Encounter for screening mammogram for malignant neoplasm of breast: Secondary | ICD-10-CM

## 2023-06-22 ENCOUNTER — Encounter: Payer: Self-pay | Admitting: Internal Medicine

## 2023-06-22 ENCOUNTER — Other Ambulatory Visit: Payer: Self-pay | Admitting: Internal Medicine

## 2023-06-22 DIAGNOSIS — R1013 Epigastric pain: Secondary | ICD-10-CM

## 2023-06-22 DIAGNOSIS — R112 Nausea with vomiting, unspecified: Secondary | ICD-10-CM

## 2023-06-23 ENCOUNTER — Encounter (HOSPITAL_COMMUNITY): Payer: Self-pay | Admitting: Pharmacy Technician

## 2023-06-23 ENCOUNTER — Emergency Department (HOSPITAL_COMMUNITY): Payer: Medicare Other

## 2023-06-23 ENCOUNTER — Inpatient Hospital Stay (HOSPITAL_COMMUNITY)
Admission: EM | Admit: 2023-06-23 | Discharge: 2023-06-30 | DRG: 689 | Disposition: A | Discharge status: Home / Self Care | Payer: Medicare Other | Attending: Family Medicine | Admitting: Family Medicine

## 2023-06-23 ENCOUNTER — Other Ambulatory Visit: Payer: Self-pay

## 2023-06-23 DIAGNOSIS — Z853 Personal history of malignant neoplasm of breast: Secondary | ICD-10-CM

## 2023-06-23 DIAGNOSIS — Z923 Personal history of irradiation: Secondary | ICD-10-CM | POA: Diagnosis not present

## 2023-06-23 DIAGNOSIS — E785 Hyperlipidemia, unspecified: Secondary | ICD-10-CM

## 2023-06-23 DIAGNOSIS — K2950 Unspecified chronic gastritis without bleeding: Secondary | ICD-10-CM | POA: Diagnosis not present

## 2023-06-23 DIAGNOSIS — N10 Acute tubulo-interstitial nephritis: Principal | ICD-10-CM | POA: Diagnosis present

## 2023-06-23 DIAGNOSIS — Z882 Allergy status to sulfonamides status: Secondary | ICD-10-CM

## 2023-06-23 DIAGNOSIS — D649 Anemia, unspecified: Secondary | ICD-10-CM | POA: Diagnosis present

## 2023-06-23 DIAGNOSIS — R1013 Epigastric pain: Secondary | ICD-10-CM

## 2023-06-23 DIAGNOSIS — E669 Obesity, unspecified: Secondary | ICD-10-CM | POA: Diagnosis present

## 2023-06-23 DIAGNOSIS — K219 Gastro-esophageal reflux disease without esophagitis: Secondary | ICD-10-CM | POA: Diagnosis present

## 2023-06-23 DIAGNOSIS — I10 Essential (primary) hypertension: Secondary | ICD-10-CM | POA: Diagnosis present

## 2023-06-23 DIAGNOSIS — R432 Parageusia: Secondary | ICD-10-CM | POA: Insufficient documentation

## 2023-06-23 DIAGNOSIS — Z841 Family history of disorders of kidney and ureter: Secondary | ICD-10-CM | POA: Diagnosis not present

## 2023-06-23 DIAGNOSIS — Z6831 Body mass index (BMI) 31.0-31.9, adult: Secondary | ICD-10-CM

## 2023-06-23 DIAGNOSIS — Z6830 Body mass index (BMI) 30.0-30.9, adult: Secondary | ICD-10-CM

## 2023-06-23 DIAGNOSIS — K859 Acute pancreatitis without necrosis or infection, unspecified: Secondary | ICD-10-CM | POA: Diagnosis present

## 2023-06-23 DIAGNOSIS — Z8249 Family history of ischemic heart disease and other diseases of the circulatory system: Secondary | ICD-10-CM

## 2023-06-23 DIAGNOSIS — R634 Abnormal weight loss: Secondary | ICD-10-CM | POA: Diagnosis present

## 2023-06-23 DIAGNOSIS — D259 Leiomyoma of uterus, unspecified: Secondary | ICD-10-CM | POA: Diagnosis present

## 2023-06-23 DIAGNOSIS — E860 Dehydration: Secondary | ICD-10-CM | POA: Diagnosis present

## 2023-06-23 DIAGNOSIS — Z7984 Long term (current) use of oral hypoglycemic drugs: Secondary | ICD-10-CM

## 2023-06-23 DIAGNOSIS — R933 Abnormal findings on diagnostic imaging of other parts of digestive tract: Secondary | ICD-10-CM

## 2023-06-23 DIAGNOSIS — Z79899 Other long term (current) drug therapy: Secondary | ICD-10-CM

## 2023-06-23 DIAGNOSIS — Z888 Allergy status to other drugs, medicaments and biological substances status: Secondary | ICD-10-CM | POA: Diagnosis not present

## 2023-06-23 DIAGNOSIS — E119 Type 2 diabetes mellitus without complications: Secondary | ICD-10-CM | POA: Diagnosis present

## 2023-06-23 DIAGNOSIS — N179 Acute kidney failure, unspecified: Secondary | ICD-10-CM | POA: Diagnosis present

## 2023-06-23 DIAGNOSIS — E872 Acidosis, unspecified: Secondary | ICD-10-CM | POA: Diagnosis present

## 2023-06-23 DIAGNOSIS — K2289 Other specified disease of esophagus: Secondary | ICD-10-CM | POA: Diagnosis not present

## 2023-06-23 DIAGNOSIS — E876 Hypokalemia: Secondary | ICD-10-CM | POA: Diagnosis present

## 2023-06-23 DIAGNOSIS — R112 Nausea with vomiting, unspecified: Secondary | ICD-10-CM

## 2023-06-23 LAB — URINALYSIS, ROUTINE W REFLEX MICROSCOPIC
Bilirubin Urine: NEGATIVE
Glucose, UA: NEGATIVE mg/dL
Hgb urine dipstick: NEGATIVE
Ketones, ur: NEGATIVE mg/dL
Nitrite: NEGATIVE
Protein, ur: NEGATIVE mg/dL
Specific Gravity, Urine: 1.005 (ref 1.005–1.030)
pH: 6 (ref 5.0–8.0)

## 2023-06-23 LAB — BASIC METABOLIC PANEL
Anion gap: 14 (ref 5–15)
BUN: 21 mg/dL (ref 8–23)
CO2: 19 mmol/L — ABNORMAL LOW (ref 22–32)
Calcium: 9.2 mg/dL (ref 8.9–10.3)
Chloride: 109 mmol/L (ref 98–111)
Creatinine, Ser: 2.38 mg/dL — ABNORMAL HIGH (ref 0.44–1.00)
GFR, Estimated: 23 mL/min — ABNORMAL LOW (ref >60)
Glucose, Bld: 80 mg/dL (ref 70–99)
Potassium: 3.6 mmol/L (ref 3.5–5.1)
Sodium: 142 mmol/L (ref 135–145)

## 2023-06-23 LAB — CBC
HCT: 32.5 % — ABNORMAL LOW (ref 36.0–46.0)
Hemoglobin: 10.1 g/dL — ABNORMAL LOW (ref 12.0–15.0)
MCH: 27.2 pg (ref 26.0–34.0)
MCHC: 31.1 g/dL (ref 30.0–36.0)
MCV: 87.4 fL (ref 80.0–100.0)
Platelets: 262 10*3/uL (ref 150–400)
RBC: 3.72 MIL/uL — ABNORMAL LOW (ref 3.87–5.11)
RDW: 13.8 % (ref 11.5–15.5)
WBC: 9.4 10*3/uL (ref 4.0–10.5)
nRBC: 0 % (ref 0.0–0.2)

## 2023-06-23 LAB — HEPATIC FUNCTION PANEL
ALT: 15 U/L (ref 0–44)
AST: 19 U/L (ref 15–41)
Albumin: 3.1 g/dL — ABNORMAL LOW (ref 3.5–5.0)
Alkaline Phosphatase: 74 U/L (ref 38–126)
Bilirubin, Direct: <0.1 mg/dL (ref 0.0–0.2)
Total Bilirubin: 0.1 mg/dL — ABNORMAL LOW (ref 0.3–1.2)
Total Protein: 8.1 g/dL (ref 6.5–8.1)

## 2023-06-23 LAB — LIPASE, BLOOD: Lipase: 38 U/L (ref 11–51)

## 2023-06-23 LAB — HEMOGLOBIN A1C
Hgb A1c MFr Bld: 6.4 % — ABNORMAL HIGH (ref 4.8–5.6)
Mean Plasma Glucose: 136.98 mg/dL

## 2023-06-23 LAB — HIV ANTIBODY (ROUTINE TESTING W REFLEX): HIV Screen 4th Generation wRfx: NONREACTIVE

## 2023-06-23 LAB — GLUCOSE, CAPILLARY: Glucose-Capillary: 77 mg/dL (ref 70–99)

## 2023-06-23 MED ORDER — SODIUM CHLORIDE 0.9 % IV SOLN: INTRAVENOUS | Status: AC

## 2023-06-23 MED ORDER — INSULIN ASPART 100 UNIT/ML IJ SOLN
0.0000 [IU] | Freq: Three times a day (TID) | INTRAMUSCULAR | Status: DC
Start: 2023-06-24 — End: 2023-06-30

## 2023-06-23 MED ORDER — ROSUVASTATIN CALCIUM 20 MG PO TABS
20.0000 mg | ORAL_TABLET | Freq: Every day | ORAL | Status: DC
  Administered 2023-06-23 – 2023-06-30 (×8): 20 mg via ORAL
  Filled 2023-06-23 (×8): qty 1

## 2023-06-23 MED ORDER — HEPARIN SODIUM (PORCINE) 5000 UNIT/ML IJ SOLN
5000.0000 [IU] | Freq: Three times a day (TID) | INTRAMUSCULAR | Status: DC
  Administered 2023-06-23 – 2023-06-25 (×5): 5000 [IU] via SUBCUTANEOUS
  Filled 2023-06-23 (×6): qty 1

## 2023-06-23 MED ORDER — SODIUM CHLORIDE 0.9 % IV BOLUS
1000.0000 mL | Freq: Once | INTRAVENOUS | Status: AC
  Administered 2023-06-23: 1000 mL via INTRAVENOUS

## 2023-06-23 MED ORDER — ACETAMINOPHEN 325 MG PO TABS: 650.0000 mg | ORAL_TABLET | Freq: Four times a day (QID) | ORAL | Status: DC | PRN

## 2023-06-23 MED ORDER — ACETAMINOPHEN 650 MG RE SUPP: 650.0000 mg | Freq: Four times a day (QID) | RECTAL | Status: DC | PRN

## 2023-06-23 MED ORDER — INSULIN ASPART 100 UNIT/ML IJ SOLN: 0.0000 [IU] | Freq: Every day | INTRAMUSCULAR | Status: DC

## 2023-06-23 MED ORDER — PANTOPRAZOLE SODIUM 40 MG PO TBEC
40.0000 mg | DELAYED_RELEASE_TABLET | Freq: Two times a day (BID) | ORAL | Status: DC
  Administered 2023-06-23 – 2023-06-25 (×4): 40 mg via ORAL
  Filled 2023-06-23 (×4): qty 1

## 2023-06-23 NOTE — ED Provider Notes (Signed)
[facility]ERGENCY DEPARTMENT AT [facility] Provider Note   CSN: [id number] Arrival date & time: 06/23/23  1328     History  Chief Complaint  Patient presents with   Abnormal Lab    Brittany Colon is a 62 y.o. female with a past medical history significant for breast cancer who presents to the ED from PCP office due to AKI.  Patient states she had labs drawn roughly 1 week ago which were redrawn today which showed a significant AKI.  Patient notes she has had labs within the past year which showed normal kidney function.  No history of CKD.  Patient admits to upper abdominal pain for the past 2 to 3 weeks associated with intermittent emesis.  She admits to numerous episodes of nonbloody, nonbilious emesis.  Has not had any emesis over the past 5 days.  Denies chronic NSAIDs or alcohol use.  Denies associated chest pain and shortness of breath.  Denies melena and hematochezia. Normal po intake. No fever or chills. Gallbladder intact. Abdominal pain constant. Recently diagnosed with AOM and thrush.  History obtained from patient and past medical records. No interpreter used during encounter.       Home Medications Prior to Admission medications   Medication Sig Start Date End Date Taking? Authorizing Provider  acetaminophen (TYLENOL) 500 MG tablet Take 1,000 mg by mouth every 8 (eight) hours as needed for mild pain (pain score 1-3) or moderate pain (pain score 4-6).   Yes [provider]  amoxicillin-clavulanate (AUGMENTIN) 875-125 MG tablet Take 1 tablet by mouth 2 (two) times daily. 06/08/23  Yes [provider]  calcium carbonate (SUPER CALCIUM) 1500 (600 Ca) MG TABS tablet Take 600 mg by mouth daily.   Yes [provider]  ibuprofen (ADVIL) 800 MG tablet Take 800 mg by mouth every 8 (eight) hours as needed for mild pain (pain score 1-3) or moderate pain (pain score 4-6).   Yes [provider]  Melaton-Thean-Cham-PassF-LBalm  (MELATONIN + L-THEANINE PO) Take 1 capsule by mouth at bedtime as needed.   Yes [provider]  metFORMIN (GLUCOPHAGE-XR) 500 MG 24 hr tablet Take 500 mg by mouth See admin instructions. Take 1 tablet by mouth with evening meal every day.   Yes [provider]  Multiple Vitamins-Minerals (MULTIVITAMIN PO) Take by mouth daily.   Yes [provider]  ondansetron (ZOFRAN) 8 MG tablet Take 1 tablet (8 mg total) by mouth every 8 (eight) hours as needed for nausea or vomiting. 07/07/18  Yes Rodriguez-Southworth, Nettie Elm, PA-C  pantoprazole (PROTONIX) 40 MG tablet Take 40 mg by mouth 2 (two) times daily. 06/15/23  Yes [provider]  rosuvastatin (CRESTOR) 20 MG tablet Take 1 tablet by mouth daily. 03/18/21  Yes [provider]  nystatin (MYCOSTATIN) 100000 UNIT/ML suspension Take 5 mLs by mouth 4 (four) times daily. 06/08/23   [provider]      Allergies    Elemental sulfur and Sulfa antibiotics    Review of Systems   Review of Systems  Constitutional:  Negative for chills and fever.  Respiratory:  Negative for shortness of breath.   Cardiovascular:  Negative for chest pain.  Gastrointestinal:  Positive for abdominal pain, nausea and vomiting. Negative for diarrhea.    Physical Exam Updated Vital Signs BP 139/71 (BP Location: Right Arm)   Pulse 89   Temp 99.3 F (37.4 C) (Oral)   Resp 18   Ht 5\' 2"  (1.575 m)   Wt 77.1  kg   SpO2 100%   BMI 31.09 kg/m  Physical Exam Vitals and nursing note reviewed.  Constitutional:      General: She is not in acute distress.    Appearance: She is not ill-appearing.  HENT:     Head: Normocephalic.  Eyes:     Pupils: Pupils are equal, round, and reactive to light.  Cardiovascular:     Rate and Rhythm: Normal rate and regular rhythm.     Pulses: Normal pulses.     Heart sounds: Normal heart sounds. No murmur heard.    No friction rub. No gallop.  Pulmonary:     Effort: Pulmonary effort is  normal.     Breath sounds: Normal breath sounds.  Abdominal:     General: Abdomen is flat. There is no distension.     Palpations: Abdomen is soft.     Tenderness: There is abdominal tenderness. There is no guarding or rebound.     Comments: Epigastric tenderness  Musculoskeletal:        General: Normal range of motion.     Cervical back: Neck supple.  Skin:    General: Skin is warm and dry.  Neurological:     General: No focal deficit present.     Mental Status: She is alert.  Psychiatric:        Mood and Affect: Mood normal.        Behavior: Behavior normal.     ED Results / Procedures / Treatments   Labs (all labs ordered are listed, but only abnormal results are displayed) Labs Reviewed  BASIC METABOLIC PANEL - Abnormal; Notable for the following components:      Result Value   CO2 19 (*)    Creatinine, Ser 2.38 (*)    GFR, Estimated 23 (*)    All other components within normal limits  CBC - Abnormal; Notable for the following components:   RBC 3.72 (*)    Hemoglobin 10.1 (*)    HCT 32.5 (*)    All other components within normal limits  HEPATIC FUNCTION PANEL - Abnormal; Notable for the following components:   Albumin 3.1 (*)    Total Bilirubin 0.1 (*)    All other components within normal limits  URINALYSIS, ROUTINE W REFLEX MICROSCOPIC - Abnormal; Notable for the following components:   Color, Urine STRAW (*)    Leukocytes,Ua SMALL (*)    Bacteria, UA RARE (*)    All other components within normal limits  HEMOGLOBIN A1C - Abnormal; Notable for the following components:   Hgb A1c MFr Bld 6.4 (*)    All other components within normal limits  LIPASE, BLOOD  GLUCOSE, CAPILLARY  HIV ANTIBODY (ROUTINE TESTING W REFLEX)  VITAMIN B12  FOLATE  IRON AND TIBC  FERRITIN  RETICULOCYTES  BASIC METABOLIC PANEL    EKG None  Radiology CT ABDOMEN PELVIS WO CONTRAST  Result Date: 06/23/2023 CLINICAL DATA:  Epigastric pain EXAM: CT ABDOMEN AND PELVIS WITHOUT  CONTRAST TECHNIQUE: Multidetector CT imaging of the abdomen and pelvis was performed following the standard protocol without IV contrast. RADIATION DOSE REDUCTION: This exam was performed according to the departmental dose-optimization program which includes automated exposure control, adjustment of the mA and/or kV according to patient size and/or use of iterative reconstruction technique. COMPARISON:  None Available. FINDINGS: Lower chest: No acute abnormality. Hepatobiliary: Unremarkable. Pancreas: Question edema within the pancreas and possible subtle haziness of the peripancreatic fat. Spleen: Unremarkable. Adrenals/Urinary Tract: Unremarkable adrenal glands and bladder. No  urinary calculi or hydronephrosis. Stomach/Bowel: No obstruction or bowel wall thickening. Vascular/Lymphatic: No significant vascular findings are present. No enlarged abdominal or pelvic lymph nodes. Reproductive: Fibroid uterus.  No adnexal mass. Other: No free intraperitoneal fluid or air. Musculoskeletal: No acute fracture. IMPRESSION: 1. Question edema within the pancreas and possible subtle haziness of the peripancreatic fat. This may be within normal limits though correlation with lipase is recommended. 2. Fibroid uterus. Electronically Signed   By: Minerva Fester M.D.   On: 06/23/2023 19:19    Procedures .Critical Care  Performed by: Mannie Stabile, PA-C Authorized by: Mannie Stabile, PA-C   Critical care provider statement:    Critical care time (minutes):  30   Critical care was necessary to treat or prevent imminent or life-threatening deterioration of the following conditions:  Dehydration and renal failure   Critical care was time spent personally by me on the following activities:  Development of treatment plan with patient or surrogate, discussions with consultants, evaluation of patient's response to treatment, examination of patient, ordering and review of laboratory studies, ordering and review of  radiographic studies, ordering and performing treatments and interventions, pulse oximetry, re-evaluation of patient's condition and review of old charts   I assumed direction of critical care for this patient from another provider in my specialty: no     Care discussed with: admitting provider       Medications Ordered in ED Medications  rosuvastatin (CRESTOR) tablet 20 mg (has no administration in time range)  pantoprazole (PROTONIX) EC tablet 40 mg (has no administration in time range)  heparin injection 5,000 Units (has no administration in time range)  acetaminophen (TYLENOL) tablet 650 mg (has no administration in time range)    Or  acetaminophen (TYLENOL) suppository 650 mg (has no administration in time range)  insulin aspart (novoLOG) injection 0-5 Units (has no administration in time range)  insulin aspart (novoLOG) injection 0-9 Units (has no administration in time range)  0.9 %  sodium chloride infusion (has no administration in time range)  sodium chloride 0.9 % bolus 1,000 mL (0 mLs Intravenous Stopped 06/23/23 1800)    ED Course/ Medical Decision Making/ A&P Clinical Course as of 06/23/23 2122  Tue Jun 23, 2023  1939 Reassessed patient at bedside.  Patient resting comfortably in bed.  Will discuss with hospitalist for admission. [CA]    Clinical Course User Index [CA] Mannie Stabile, PA-C                                 Medical Decision Making Amount and/or Complexity of Data Reviewed Labs: ordered. Decision-making details documented in ED Course. Radiology: ordered and independent interpretation performed. Decision-making details documented in ED Course.  Risk Decision regarding hospitalization.   This patient presents to the ED for concern of epigastric pain, this involves an extensive number of treatment options, and is a complaint that carries with it a high risk of complications and morbidity.  The differential diagnosis includes pancreatitis, acute  cholecystitis, atypical ACS, malignancy, etc  62 year old female presents to the ED from PCP due to AKI.  Typically has normal renal function per patient.  Patient admits to upper abdominal pain associated with nonbloody, nonbilious emesis for the past 2 to 3 weeks.  No emesis for the past 5 days.  Denies chest pain and shortness of breath.  Recently treated for acute otitis media and thrush.  No fever or chills.  No chest pain or shortness of breath.  Upon arrival, stable vitals.  Patient in no acute distress.  Abdomen soft, nondistended with epigastric tenderness.  CBC and BMP ordered in triage.  Added hepatic function panel and lipase to rule out pancreatitis.  CT abdomen ordered.  IV fluids given.  BMP significant for creatinine at 2.38.  No recent labs in EMR however, patient notes she has had normal labs within the past year.  No history of CKD.  CBC significant for anemia with hemoglobin 10.1.  No leukocytosis.  Lipase normal.  Low suspicion for pancreatitis.  Hepatic function panel with normal LFTs.  CT abdomen personally reviewed and interpreted which demonstrates questionable edema within the pancreas which could be a normal variant.  Given normal lipase lower suspicion for pancreatitis.  Unclear etiology of epigastric pain.  Patient will require admission for AKI.  Will discuss with hospitalist.  EKG with sinus rhythm.  Low suspicion for atypical ACS.  7:49 PM Discussed with Dr. Loney Loh with [facility] who agrees to admit patient.    Co morbidities that complicate the patient evaluation  Breast cancer Cardiac Monitoring: / EKG:  The patient was maintained on a cardiac monitor.  I personally viewed and interpreted the cardiac monitored which showed an underlying rhythm of: NSR        Final Clinical Impression(s) / ED Diagnoses Final diagnoses:  AKI (acute kidney injury) [facility])    Rx / DC Orders ED Discharge Orders     None         Jesusita Oka 06/23/23 2123     Anders Simmonds T, DO 06/23/23 2356

## 2023-06-23 NOTE — ED Notes (Signed)
ED TO INPATIENT HANDOFF REPORT  ED Nurse Name and Phone #: Darryll Capers, [place] [phone]  S Name/Age/Gender Brittany Colon 62 y.o. female Room/Bed: 020C/020C  Code Status   Code Status: Not on file  Home/SNF/Other Home Patient oriented to: self, place, time, and situation Is this baseline? Yes   Triage Complete: Triage complete  Chief Complaint AKI (acute kidney injury) (HCC) [N17.9]  Triage Note Pt here with reports of having blood work done at PCP office yesterday and was called today and told to come to the ED for abnormal kidney function. Pt reports several weeks ago she was throwing up.    Allergies Allergies  Allergen Reactions   Elemental Sulfur     vomiting   Sulfa Antibiotics     Level of Care/Admitting Diagnosis ED Disposition     ED Disposition  Admit   Condition  --   Comment  Hospital Area: [facility] [[id number]]  Level of Care: Med-Surg [16]  May admit patient to [facility] or [facility] if equivalent level of care is available:: Yes  Covid Evaluation: Asymptomatic - no recent exposure (last 10 days) testing not required  Diagnosis: AKI (acute kidney injury) [facility]) [[id number]]  Admitting Physician: John Giovanni [[id number]]  Attending Physician: John Giovanni [[id number]]  Certification:: I certify this patient will need inpatient services for at least 2 midnights  Expected Medical Readiness: 06/25/2023          B Medical/Surgery History Past Medical History:  Diagnosis Date   Allergy    Breast cancer (HCC) 12/14/12   right   Breast cyst 12/18/12   mult bilaterally per MRI   Headache(784.0)    History of radiation therapy 02/09/2013-03/31/2013   62.4 gray to the right breast   Personal history of radiation therapy 2014   Right Breast Cancer   Past Surgical History:  Procedure Laterality Date   BREAST BIOPSY Right 2014   BREAST LUMPECTOMY Right 2014   BREAST LUMPECTOMY WITH NEEDLE LOCALIZATION Right 01/10/2013    Procedure: BREAST LUMPECTOMY WITH NEEDLE LOCALIZATION;  Surgeon: Mariella Saa, MD;  Location: [facility];  Service: General;  Laterality: Right;   CARPAL TUNNEL RELEASE Right    FOOT SURGERY  1993   bone spur lt foot   KNEE SURGERY Bilateral      A IV Location/Drains/Wounds Patient Lines/Drains/Airways Status     Active Line/Drains/Airways     Name Placement date Placement time Site Days   Peripheral IV 06/23/23 20 G Anterior;Left;Proximal Forearm 06/23/23  1554  Forearm  less than 1            Intake/Output Last 24 hours  Intake/Output Summary (Last 24 hours) at 06/23/2023 2026 Last data filed at 06/23/2023 1800 Gross per 24 hour  Intake 1000 ml  Output --  Net 1000 ml    Labs/Imaging Results for orders placed or performed during the hospital encounter of 06/23/23 (from the past 48 hour(s))  Basic metabolic panel     Status: Abnormal   Collection Time: 06/23/23  1:44 PM  Result Value Ref Range   Sodium 142 135 - 145 mmol/L   Potassium 3.6 3.5 - 5.1 mmol/L   Chloride 109 98 - 111 mmol/L   CO2 19 (L) 22 - 32 mmol/L   Glucose, Bld 80 70 - 99 mg/dL    Comment: Glucose reference range applies only to samples taken after fasting for at least 8 hours.   BUN 21 8 - 23 mg/dL  Creatinine, Ser 2.38 (H) 0.44 - 1.00 mg/dL   Calcium 9.2 8.9 - 16.1 mg/dL   GFR, Estimated 23 (L) >60 mL/min    Comment: (NOTE) Calculated using the CKD-EPI Creatinine Equation (2021)    Anion gap 14 5 - 15    Comment: Performed at [facility], [address], [place], [place] [address]  CBC     Status: Abnormal   Collection Time: 06/23/23  1:44 PM  Result Value Ref Range   WBC 9.4 4.0 - 10.5 K/uL   RBC 3.72 (L) 3.87 - 5.11 MIL/uL   Hemoglobin 10.1 (L) 12.0 - 15.0 g/dL   HCT 54.0 (L) 98.1 - 19.1 %   MCV 87.4 80.0 - 100.0 fL   MCH 27.2 26.0 - 34.0 pg   MCHC 31.1 30.0 - 36.0 g/dL   RDW 47.8 29.5 - 62.1 %   Platelets 262 150 - 400 K/uL   nRBC 0.0 0.0 - 0.2 %     Comment: Performed at [facility], [address], [place], [place] [address]  Hepatic function panel     Status: Abnormal   Collection Time: 06/23/23  1:44 PM  Result Value Ref Range   Total Protein 8.1 6.5 - 8.1 g/dL   Albumin 3.1 (L) 3.5 - 5.0 g/dL   AST 19 15 - 41 U/L   ALT 15 0 - 44 U/L   Alkaline Phosphatase 74 38 - 126 U/L   Total Bilirubin 0.1 (L) 0.3 - 1.2 mg/dL   Bilirubin, Direct <7.8 0.0 - 0.2 mg/dL   Indirect Bilirubin NOT CALCULATED 0.3 - 0.9 mg/dL    Comment: Performed at [facility], [address], [place], [place] [address]  Lipase, blood     Status: None   Collection Time: 06/23/23  1:44 PM  Result Value Ref Range   Lipase 38 11 - 51 U/L    Comment: Performed at [facility], [address] [place], [place] [address]   CT ABDOMEN PELVIS WO CONTRAST  Result Date: 06/23/2023 CLINICAL DATA:  Epigastric pain EXAM: CT ABDOMEN AND PELVIS WITHOUT CONTRAST TECHNIQUE: Multidetector CT imaging of the abdomen and pelvis was performed following the standard protocol without IV contrast. RADIATION DOSE REDUCTION: This exam was performed according to the departmental dose-optimization program which includes automated exposure control, adjustment of the mA and/or kV according to patient size and/or use of iterative reconstruction technique. COMPARISON:  None Available. FINDINGS: Lower chest: No acute abnormality. Hepatobiliary: Unremarkable. Pancreas: Question edema within the pancreas and possible subtle haziness of the peripancreatic fat. Spleen: Unremarkable. Adrenals/Urinary Tract: Unremarkable adrenal glands and bladder. No urinary calculi or hydronephrosis. Stomach/Bowel: No obstruction or bowel wall thickening. Vascular/Lymphatic: No significant vascular findings are present. No enlarged abdominal or pelvic lymph nodes. Reproductive: Fibroid uterus.  No adnexal mass. Other: No free intraperitoneal fluid or air. Musculoskeletal: No acute fracture. IMPRESSION: 1.  Question edema within the pancreas and possible subtle haziness of the peripancreatic fat. This may be within normal limits though correlation with lipase is recommended. 2. Fibroid uterus. Electronically Signed   By: Minerva Fester M.D.   On: 06/23/2023 19:19    Pending Labs Unresulted Labs (From admission, onward)     Start     Ordered   06/23/23 1538  Urinalysis, Routine w reflex microscopic -Urine, Clean Catch  Once,   URGENT       Question:  Specimen Source  Answer:  Urine, Clean Catch   06/23/23 1537  Vitals/Pain Today's Vitals   06/23/23 1341 06/23/23 1630 06/23/23 1631 06/23/23 1830  BP: (!) 165/100 131/71  138/76  Pulse: 97 85  84  Resp: 18 17  19   Temp: 99.1 F (37.3 C) 98.9 F (37.2 C)    TempSrc:  Oral    SpO2: 97% 99%  100%  Weight:   77.1 kg   Height:   5\' 2"  (1.575 m)   PainSc:        Isolation Precautions No active isolations  Medications Medications  sodium chloride 0.9 % bolus 1,000 mL (0 mLs Intravenous Stopped 06/23/23 1800)    Mobility walks     Focused Assessments Renal Assessment Handoff:  Hemodialysis Schedule:  Last Hemodialysis date and time: not a Dialysis pt   Restricted appendage:     R Recommendations: See Admitting Provider Note  Report given to:   Additional Notes: UA was just sent - Admission nurse in room right now

## 2023-06-23 NOTE — ED Triage Notes (Signed)
Pt here with reports of having blood work done at PCP office yesterday and was called today and told to come to the ED for abnormal kidney function. Pt reports several weeks ago she was throwing up.

## 2023-06-23 NOTE — H&P (Signed)
History and Physical    Brittany Colon NGE:[id number] DOB: Mar 19, 1961 DOA: 06/23/2023  PCP: Thayer Headings, MD (Inactive)  Patient coming from: Home  Chief Complaint: Abnormal labs  HPI: Brittany Colon is a 62 y.o. female with medical history significant of breast cancer in remission, hyperlipidemia, type 2 diabetes, GERD send to the ED by her PCP for evaluation of AKI in the setting of abdominal pain and vomiting over the past few weeks.  Afebrile.  Labs showing no leukocytosis, hemoglobin 10.1, MCV 87.4, bicarb 19, glucose 80, BUN 21, creatinine 2.3 (no recent labs for comparison), no elevation of lipase and LFTs, UA pending.  CT abdomen pelvis showing questionable edema within the pancreas and possible subtle haziness of the peripancreatic fat. Patient was given 1 L normal saline.  [facility] called to admit.  Patient states she recently had nausea and vomiting for about a week.  Vomiting stopped about 5 days ago but her appetite continues to be poor.  Also she continues to have epigastric abdominal pain.  Denies alcohol use.  States she gets blood work done annually during her physical and has never been told that she had kidney disease.  Last had labs done in January and at that time kidney function was normal.  Then about a week ago she saw her PCP due to her having GI symptoms and had labs done and was told that her kidney function was abnormal.  She had another follow-up visit for labs yesterday and was told that her kidney function was becoming worse and she was advised to come into the ED to be evaluated.  She denies NSAID use.  Denies shortness of breath or chest pain.  Review of Systems:  Review of Systems  All other systems reviewed and are negative.   Past Medical History:  Diagnosis Date   Allergy    Breast cancer (HCC) 12/14/12   right   Breast cyst 12/18/12   mult bilaterally per MRI   Headache(784.0)    History of radiation therapy 02/09/2013-03/31/2013   62.4 gray to  the right breast   Personal history of radiation therapy 2014   Right Breast Cancer    Past Surgical History:  Procedure Laterality Date   BREAST BIOPSY Right 2014   BREAST LUMPECTOMY Right 2014   BREAST LUMPECTOMY WITH NEEDLE LOCALIZATION Right 01/10/2013   Procedure: BREAST LUMPECTOMY WITH NEEDLE LOCALIZATION;  Surgeon: Mariella Saa, MD;  Location: [facility];  Service: General;  Laterality: Right;   CARPAL TUNNEL RELEASE Right    FOOT SURGERY  1993   bone spur lt foot   KNEE SURGERY Bilateral      reports that she has never smoked. She does not have any smokeless tobacco history on file. She reports that she does not drink alcohol and does not use drugs.  Allergies  Allergen Reactions   Elemental Sulfur     vomiting   Sulfa Antibiotics     Family History  Problem Relation Age of Onset   Heart disease Mother     Prior to Admission medications   Medication Sig Start Date End Date Taking? Authorizing Provider  acetaminophen (TYLENOL) 500 MG tablet Take 1,000 mg by mouth every 8 (eight) hours as needed for mild pain (pain score 1-3) or moderate pain (pain score 4-6).   Yes [provider]  amoxicillin-clavulanate (AUGMENTIN) 875-125 MG tablet Take 1 tablet by mouth 2 (two) times daily. 06/08/23  Yes [provider]  calcium carbonate (SUPER CALCIUM)  1500 (600 Ca) MG TABS tablet Take 600 mg by mouth daily.   Yes [provider]  ibuprofen (ADVIL) 800 MG tablet Take 800 mg by mouth every 8 (eight) hours as needed for mild pain (pain score 1-3) or moderate pain (pain score 4-6).   Yes [provider]  Melaton-Thean-Cham-PassF-LBalm (MELATONIN + L-THEANINE PO) Take 1 capsule by mouth at bedtime as needed.   Yes [provider]  metFORMIN (GLUCOPHAGE-XR) 500 MG 24 hr tablet Take 500 mg by mouth See admin instructions. Take 1 tablet by mouth with evening meal every day.   Yes [provider]  Multiple  Vitamins-Minerals (MULTIVITAMIN PO) Take by mouth daily.   Yes [provider]  ondansetron (ZOFRAN) 8 MG tablet Take 1 tablet (8 mg total) by mouth every 8 (eight) hours as needed for nausea or vomiting. 07/07/18  Yes Rodriguez-Southworth, Nettie Elm, PA-C  pantoprazole (PROTONIX) 40 MG tablet Take 40 mg by mouth 2 (two) times daily. 06/15/23  Yes [provider]  rosuvastatin (CRESTOR) 20 MG tablet Take 1 tablet by mouth daily. 03/18/21  Yes [provider]  nystatin (MYCOSTATIN) 100000 UNIT/ML suspension Take 5 mLs by mouth 4 (four) times daily. 06/08/23   [provider]    Physical Exam: Vitals:   06/23/23 1341 06/23/23 1630 06/23/23 1631 06/23/23 1830  BP: (!) 165/100 131/71  138/76  Pulse: 97 85  84  Resp: 18 17  19   Temp: 99.1 F (37.3 C) 98.9 F (37.2 C)    TempSrc:  Oral    SpO2: 97% 99%  100%  Weight:   77.1 kg   Height:   5\' 2"  (1.575 m)     Physical Exam Vitals reviewed.  Constitutional:      General: She is not in acute distress. HENT:     Head: Normocephalic and atraumatic.  Eyes:     Extraocular Movements: Extraocular movements intact.  Cardiovascular:     Rate and Rhythm: Normal rate and regular rhythm.     Pulses: Normal pulses.  Pulmonary:     Effort: Pulmonary effort is normal. No respiratory distress.     Breath sounds: Normal breath sounds. No wheezing or rales.  Abdominal:     General: Bowel sounds are normal. There is no distension.     Palpations: Abdomen is soft.     Tenderness: There is no abdominal tenderness. There is no guarding or rebound.  Musculoskeletal:     Cervical back: Normal range of motion.     Right lower leg: No edema.     Left lower leg: No edema.  Skin:    General: Skin is warm and dry.  Neurological:     General: No focal deficit present.     Mental Status: She is alert and oriented to person, place, and time.     Labs on Admission: I have personally reviewed following labs and imaging  studies  CBC: Recent Labs  Lab 06/23/23 1344  WBC 9.4  HGB 10.1*  HCT 32.5*  MCV 87.4  PLT 262   Basic Metabolic Panel: Recent Labs  Lab 06/23/23 1344  NA 142  K 3.6  CL 109  CO2 19*  GLUCOSE 80  BUN 21  CREATININE 2.38*  CALCIUM 9.2   GFR: Estimated Creatinine Clearance: 23.9 mL/min (A) (by C-G formula based on SCr of 2.38 mg/dL (H)). Liver Function Tests: Recent Labs  Lab 06/23/23 1344  AST 19  ALT 15  ALKPHOS 74  BILITOT 0.1*  PROT  8.1  ALBUMIN 3.1*   Recent Labs  Lab 06/23/23 1344  LIPASE 38   No results for input(s): "AMMONIA" in the last 168 hours. Coagulation Profile: No results for input(s): "INR", "PROTIME" in the last 168 hours. Cardiac Enzymes: No results for input(s): "CKTOTAL", "CKMB", "CKMBINDEX", "TROPONINI" in the last 168 hours. BNP (last 3 results) No results for input(s): "PROBNP" in the last 8760 hours. HbA1C: No results for input(s): "HGBA1C" in the last 72 hours. CBG: No results for input(s): "GLUCAP" in the last 168 hours. Lipid Profile: No results for input(s): "CHOL", "HDL", "LDLCALC", "TRIG", "CHOLHDL", "LDLDIRECT" in the last 72 hours. Thyroid Function Tests: No results for input(s): "TSH", "T4TOTAL", "FREET4", "T3FREE", "THYROIDAB" in the last 72 hours. Anemia Panel: No results for input(s): "VITAMINB12", "FOLATE", "FERRITIN", "TIBC", "IRON", "RETICCTPCT" in the last 72 hours. Urine analysis:    Component Value Date/Time   LABSPEC 1.020 11/21/2008 1036   PHURINE 6.5 11/21/2008 1036   GLUCOSEU NEGATIVE 11/21/2008 1036   HGBUR NEGATIVE 11/21/2008 1036   BILIRUBINUR NEGATIVE 11/21/2008 1036   KETONESUR NEGATIVE 11/21/2008 1036   PROTEINUR NEGATIVE 11/21/2008 1036   UROBILINOGEN 0.2 11/21/2008 1036   NITRITE NEGATIVE 11/21/2008 1036   LEUKOCYTESUR  11/21/2008 1036    NEGATIVE Biochemical Testing Only. Please order routine urinalysis from main lab if confirmatory testing is needed.    Radiological Exams on  Admission: CT ABDOMEN PELVIS WO CONTRAST  Result Date: 06/23/2023 CLINICAL DATA:  Epigastric pain EXAM: CT ABDOMEN AND PELVIS WITHOUT CONTRAST TECHNIQUE: Multidetector CT imaging of the abdomen and pelvis was performed following the standard protocol without IV contrast. RADIATION DOSE REDUCTION: This exam was performed according to the departmental dose-optimization program which includes automated exposure control, adjustment of the mA and/or kV according to patient size and/or use of iterative reconstruction technique. COMPARISON:  None Available. FINDINGS: Lower chest: No acute abnormality. Hepatobiliary: Unremarkable. Pancreas: Question edema within the pancreas and possible subtle haziness of the peripancreatic fat. Spleen: Unremarkable. Adrenals/Urinary Tract: Unremarkable adrenal glands and bladder. No urinary calculi or hydronephrosis. Stomach/Bowel: No obstruction or bowel wall thickening. Vascular/Lymphatic: No significant vascular findings are present. No enlarged abdominal or pelvic lymph nodes. Reproductive: Fibroid uterus.  No adnexal mass. Other: No free intraperitoneal fluid or air. Musculoskeletal: No acute fracture. IMPRESSION: 1. Question edema within the pancreas and possible subtle haziness of the peripancreatic fat. This may be within normal limits though correlation with lipase is recommended. 2. Fibroid uterus. Electronically Signed   By: Minerva Fester M.D.   On: 06/23/2023 19:19    EKG: Independently reviewed.  Sinus rhythm, nonspecific T wave abnormality.  No significant change compared to previous EKG.  Assessment and Plan  AKI AKI likely prerenal in etiology from dehydration given recent GI illness/vomiting for a week and poor p.o. intake.  Creatinine currently 2.3, no recent labs in the chart for comparison.  Bicarb 19.  Reportedly no history of CKD and renal function was normal within the past year.  Patient denies NSAID use.  CT showing no urinary calculi or  hydronephrosis.  Continue IV fluid hydration and monitor labs closely.  Avoid nephrotoxic agents.  Epigastric abdominal pain, nausea, vomiting Vomiting subsided 5 days ago.  CT does show questionable edema within the pancreas and possible subtle haziness of the peripancreatic fat.  ?Acute pancreatitis but lipase currently normal.  Patient denies alcohol use.  No gallstones seen on CT.  No elevation of LFTs.  No fever or leukocytosis.  Continue symptomatic management.  Normocytic anemia Hemoglobin 10.1,  MCV 87.4.  No recent labs for comparison.  No signs or symptoms of bleeding.  Anemia panel ordered.  Hyperlipidemia Continue Crestor.  Type 2 diabetes Hold metformin at this time.  Check A1c and sensitive sliding scale insulin ACHS ordered.  GERD Continue Protonix.  Fibroid uterus Seen on CT.  Patient states she is already aware of this finding and has been evaluated by gynecology in the past.  She has no complaints at present.  DVT prophylaxis: SQ Heparin Code Status: Full Code (discussed with the patient) Level of care: Med-Surg Admission status: It is my clinical opinion that admission to INPATIENT is reasonable and necessary because of the expectation that this patient will require hospital care that crosses at least 2 midnights to treat this condition based on the medical complexity of the problems presented.  Given the aforementioned information, the predictability of an adverse outcome is felt to be significant.  John Giovanni MD [facility]  If 7PM-7AM, please contact night-coverage [email or web address]  06/23/2023, 7:50 PM

## 2023-06-23 NOTE — Plan of Care (Signed)
  Problem: Education: Goal: Knowledge of General Education information will improve Description: Including pain rating scale, medication(s)/side effects and non-pharmacologic comfort measures Outcome: Progressing   Problem: Clinical Measurements: Goal: Respiratory complications will improve Outcome: Progressing   Problem: Activity: Goal: Risk for activity intolerance will decrease Outcome: Progressing   

## 2023-06-23 NOTE — Progress Notes (Signed)
New Admission Note:   Arrival Method:  Mental Orientation: Telemetry:  Assessment: Completed Skin: IV: Pain:  Tubes: Safety Measures: Safety Fall Prevention Plan has been discussed.  Admission: Completed 5MW Orientation: Patient has been oriented to the room, unit and staff.  Family:  Orders have been reviewed and implemented. Will continue to monitor the patient. Call light has been placed within reach and bed alarm has been activated.   Charito Bokiagon BSN, RN-BC Phone number: [phone] 

## 2023-06-24 ENCOUNTER — Other Ambulatory Visit: Payer: BC Managed Care – PPO

## 2023-06-24 DIAGNOSIS — N179 Acute kidney failure, unspecified: Secondary | ICD-10-CM

## 2023-06-24 LAB — RETICULOCYTES
Immature Retic Fract: 9.8 % (ref 2.3–15.9)
RBC.: 3.18 MIL/uL — ABNORMAL LOW (ref 3.87–5.11)
Retic Count, Absolute: 25.4 10*3/uL (ref 19.0–186.0)
Retic Ct Pct: 0.8 % (ref 0.4–3.1)

## 2023-06-24 LAB — VITAMIN B12: Vitamin B-12: 761 pg/mL (ref 180–914)

## 2023-06-24 LAB — GLUCOSE, CAPILLARY
Glucose-Capillary: 98 mg/dL (ref 70–99)
Glucose-Capillary: 118 mg/dL — ABNORMAL HIGH (ref 70–99)
Glucose-Capillary: 115 mg/dL — ABNORMAL HIGH (ref 70–99)
Glucose-Capillary: 70 mg/dL (ref 70–99)

## 2023-06-24 LAB — BASIC METABOLIC PANEL
Anion gap: 4 — ABNORMAL LOW (ref 5–15)
BUN: 16 mg/dL (ref 8–23)
CO2: 21 mmol/L — ABNORMAL LOW (ref 22–32)
Calcium: 8.4 mg/dL — ABNORMAL LOW (ref 8.9–10.3)
Chloride: 114 mmol/L — ABNORMAL HIGH (ref 98–111)
Creatinine, Ser: 2.27 mg/dL — ABNORMAL HIGH (ref 0.44–1.00)
GFR, Estimated: 24 mL/min — ABNORMAL LOW (ref >60)
Glucose, Bld: 95 mg/dL (ref 70–99)
Potassium: 3.3 mmol/L — ABNORMAL LOW (ref 3.5–5.1)
Sodium: 139 mmol/L (ref 135–145)

## 2023-06-24 LAB — FOLATE: Folate: 20.7 ng/mL (ref >5.9)

## 2023-06-24 LAB — IRON AND TIBC
Iron: 25 ug/dL — ABNORMAL LOW (ref 28–170)
Saturation Ratios: 12 % (ref 10.4–31.8)
TIBC: 217 ug/dL — ABNORMAL LOW (ref 250–450)
UIBC: 192 ug/dL

## 2023-06-24 LAB — PROTEIN / CREATININE RATIO, URINE
Creatinine, Urine: 29 mg/dL
Protein Creatinine Ratio: 0.69 mg/mg{creat} — ABNORMAL HIGH (ref 0.00–0.15)
Total Protein, Urine: 20 mg/dL

## 2023-06-24 LAB — SODIUM, URINE, RANDOM: Sodium, Ur: 56 mmol/L

## 2023-06-24 LAB — FERRITIN: Ferritin: 204 ng/mL (ref 11–307)

## 2023-06-24 MED ORDER — POTASSIUM CHLORIDE CRYS ER 20 MEQ PO TBCR
40.0000 meq | EXTENDED_RELEASE_TABLET | Freq: Once | ORAL | Status: AC
  Administered 2023-06-24: 40 meq via ORAL
  Filled 2023-06-24: qty 2

## 2023-06-24 MED ORDER — DIAZEPAM 2 MG PO TABS: 5.0000 mg | ORAL_TABLET | Freq: Once | ORAL | Status: DC | PRN

## 2023-06-24 MED ORDER — SODIUM CHLORIDE 0.9 % IV SOLN: INTRAVENOUS | Status: AC

## 2023-06-24 NOTE — Plan of Care (Signed)
  Problem: Education: Goal: Ability to describe self-care measures that may prevent or decrease complications (Diabetes Survival Skills Education) will improve Outcome: Progressing Goal: Individualized Educational Video(s) Outcome: Progressing   Problem: Coping: Goal: Ability to adjust to condition or change in health will improve Outcome: Progressing   Problem: Fluid Volume: Goal: Ability to maintain a balanced intake and output will improve Outcome: Progressing   Problem: Health Behavior/Discharge Planning: Goal: Ability to identify and utilize available resources and services will improve Outcome: Progressing Goal: Ability to manage health-related needs will improve Outcome: Progressing   Problem: Metabolic: Goal: Ability to maintain appropriate glucose levels will improve Outcome: Progressing   Problem: Nutritional: Goal: Maintenance of adequate nutrition will improve Outcome: Progressing Goal: Progress toward achieving an optimal weight will improve Outcome: Progressing   Problem: Skin Integrity: Goal: Risk for impaired skin integrity will decrease Outcome: Progressing   Problem: Tissue Perfusion: Goal: Adequacy of tissue perfusion will improve Outcome: Progressing   Problem: Education: Goal: Knowledge of General Education information will improve Description: Including pain rating scale, medication(s)/side effects and non-pharmacologic comfort measures Outcome: Progressing   Problem: Health Behavior/Discharge Planning: Goal: Ability to manage health-related needs will improve Outcome: Progressing   Problem: Clinical Measurements: Goal: Ability to maintain clinical measurements within normal limits will improve Outcome: Progressing Goal: Will remain free from infection Outcome: Progressing Goal: Diagnostic test results will improve Outcome: Progressing Goal: Respiratory complications will improve Outcome: Progressing Goal: Cardiovascular complication will  be avoided Outcome: Progressing   Problem: Activity: Goal: Risk for activity intolerance will decrease Outcome: Progressing   Problem: Nutrition: Goal: Adequate nutrition will be maintained Outcome: Progressing   Problem: Coping: Goal: Level of anxiety will decrease Outcome: Progressing   Problem: Elimination: Goal: Will not experience complications related to bowel motility Outcome: Progressing Goal: Will not experience complications related to urinary retention Outcome: Progressing   Problem: Pain Management: Goal: General experience of comfort will improve Outcome: Progressing   Problem: Safety: Goal: Ability to remain free from injury will improve Outcome: Progressing   Problem: Skin Integrity: Goal: Risk for impaired skin integrity will decrease Outcome: Progressing   Problem: Education: Goal: Knowledge of disease and its progression will improve Outcome: Progressing   Problem: Health Behavior/Discharge Planning: Goal: Ability to manage health-related needs will improve Outcome: Progressing   Problem: Clinical Measurements: Goal: Complications related to the disease process or treatment will be avoided or minimized Outcome: Progressing Goal: Dialysis access will remain free of complications Outcome: Progressing   Problem: Activity: Goal: Activity intolerance will improve Outcome: Progressing   Problem: Fluid Volume: Goal: Fluid volume balance will be maintained or improved Outcome: Progressing   Problem: Nutritional: Goal: Ability to make appropriate dietary choices will improve Outcome: Progressing   Problem: Respiratory: Goal: Respiratory symptoms related to disease process will be avoided Outcome: Progressing   Problem: Self-Concept: Goal: Body image disturbance will be avoided or minimized Outcome: Progressing   Problem: Urinary Elimination: Goal: Progression of disease will be identified and treated Outcome: Progressing

## 2023-06-24 NOTE — Progress Notes (Signed)
  Progress Note   Patient: Brittany Colon [id number]:[id number] DOB: 11-30-1960 DOA: 06/23/2023     1 DOS: the patient was seen and examined on 06/24/2023 at 9:38 AM      Brief hospital course: Brittany Colon is a 62 y.o. F with hx BRcA s/p lumpectomy, mild DM well controlled, and HLD who presented with 1 month epigastric pain and nausea, and now AKI.     Assessment and Plan: AKI Cr baseline 5 months ago was 0.74 mg/dL per PCP's office (by phone).  Cr 10/21 was 2.0, then 10/28 was 2.3 and on admission 2.3 and then this morning 2.2 with fluids  Urine bland.  CT rules out hydronephrosis.  Limited OTC analgesics, doubt NSAID nephropathy.  No omeprazole.    - Check urine electrolytes - Continue IV fluids - Check urine protein   Epigastric pain Lipase normal but epigastric pain and hazy pancreas on CT.  Given 1 month timeline of pain, acute pancreatitis unlikely  - Obtain MRCP with gadolinium when Cr improves  Diabetes Glucose normal - Continue sliding scale corrections - Continue Crestor       Subjective: Patient's epigastric pain is still present but improving, she had pain today after eating breakfast of [place] toast.  No fever, no vomiting, no confusion.       Physical Exam: BP (!) 141/87   Pulse 92   Temp 98.2 F (36.8 C)   Resp 18   Ht 5\' 2"  (1.575 m)   Wt 77.1 kg   SpO2 100%   BMI 31.09 kg/m   adult female, sitting down, no acute distress RRR, no murmurs, no peripheral edema Respiratory normal, lungs clear without rales or wheezes Some epigastric guarding, otherwise no tenderness to palpation of the abdomen Attention normal, affect normal, judgment insight appear normal   Data Reviewed: Discussed outside records with PCPs office Creatinine 2.2, no change Potassium 3.3 Hemoglobin A1c 6.4% Urinalysis no cells  Family Communication: Son and daughter at the bedside    Disposition: Status is: Inpatient         Author: Alberteen Sam,  MD 06/24/2023 6:35 PM  For on call review [email or web address].

## 2023-06-24 NOTE — Hospital Course (Signed)
Brittany Colon is a 62 y.o. F with hx BrCA s/p lumpectomy, mild DM well controlled, and HLD who presented with 1 month epigastric pain and nausea, and now AKI.

## 2023-06-25 DIAGNOSIS — K2289 Other specified disease of esophagus: Secondary | ICD-10-CM | POA: Diagnosis not present

## 2023-06-25 DIAGNOSIS — R112 Nausea with vomiting, unspecified: Secondary | ICD-10-CM | POA: Diagnosis not present

## 2023-06-25 DIAGNOSIS — N179 Acute kidney failure, unspecified: Secondary | ICD-10-CM | POA: Diagnosis not present

## 2023-06-25 DIAGNOSIS — E669 Obesity, unspecified: Secondary | ICD-10-CM

## 2023-06-25 DIAGNOSIS — E119 Type 2 diabetes mellitus without complications: Secondary | ICD-10-CM

## 2023-06-25 DIAGNOSIS — R933 Abnormal findings on diagnostic imaging of other parts of digestive tract: Secondary | ICD-10-CM | POA: Diagnosis not present

## 2023-06-25 DIAGNOSIS — K859 Acute pancreatitis without necrosis or infection, unspecified: Secondary | ICD-10-CM

## 2023-06-25 DIAGNOSIS — R432 Parageusia: Secondary | ICD-10-CM | POA: Insufficient documentation

## 2023-06-25 DIAGNOSIS — E785 Hyperlipidemia, unspecified: Secondary | ICD-10-CM

## 2023-06-25 LAB — BASIC METABOLIC PANEL
Anion gap: 8 (ref 5–15)
Anion gap: 9 (ref 5–15)
BUN: 13 mg/dL (ref 8–23)
BUN: 13 mg/dL (ref 8–23)
CO2: 20 mmol/L — ABNORMAL LOW (ref 22–32)
CO2: 19 mmol/L — ABNORMAL LOW (ref 22–32)
Calcium: 8.8 mg/dL — ABNORMAL LOW (ref 8.9–10.3)
Calcium: 8.9 mg/dL (ref 8.9–10.3)
Chloride: 117 mmol/L — ABNORMAL HIGH (ref 98–111)
Chloride: 113 mmol/L — ABNORMAL HIGH (ref 98–111)
Creatinine, Ser: 2.35 mg/dL — ABNORMAL HIGH (ref 0.44–1.00)
Creatinine, Ser: 2.52 mg/dL — ABNORMAL HIGH (ref 0.44–1.00)
GFR, Estimated: 23 mL/min — ABNORMAL LOW (ref >60)
GFR, Estimated: 21 mL/min — ABNORMAL LOW (ref >60)
Glucose, Bld: 102 mg/dL — ABNORMAL HIGH (ref 70–99)
Glucose, Bld: 119 mg/dL — ABNORMAL HIGH (ref 70–99)
Potassium: 3.9 mmol/L (ref 3.5–5.1)
Potassium: 3.7 mmol/L (ref 3.5–5.1)
Sodium: 145 mmol/L (ref 135–145)
Sodium: 141 mmol/L (ref 135–145)

## 2023-06-25 LAB — CBC
HCT: 27.6 % — ABNORMAL LOW (ref 36.0–46.0)
Hemoglobin: 8.8 g/dL — ABNORMAL LOW (ref 12.0–15.0)
MCH: 27.7 pg (ref 26.0–34.0)
MCHC: 31.9 g/dL (ref 30.0–36.0)
MCV: 86.8 fL (ref 80.0–100.0)
Platelets: 230 10*3/uL (ref 150–400)
RBC: 3.18 MIL/uL — ABNORMAL LOW (ref 3.87–5.11)
RDW: 14 % (ref 11.5–15.5)
WBC: 9 10*3/uL (ref 4.0–10.5)
nRBC: 0 % (ref 0.0–0.2)

## 2023-06-25 LAB — C-REACTIVE PROTEIN: CRP: 3.5 mg/dL — ABNORMAL HIGH (ref <1.0)

## 2023-06-25 LAB — GLUCOSE, CAPILLARY
Glucose-Capillary: 91 mg/dL (ref 70–99)
Glucose-Capillary: 87 mg/dL (ref 70–99)
Glucose-Capillary: 81 mg/dL (ref 70–99)
Glucose-Capillary: 92 mg/dL (ref 70–99)

## 2023-06-25 LAB — SEDIMENTATION RATE: Sed Rate: 126 mm/h — ABNORMAL HIGH (ref 0–22)

## 2023-06-25 LAB — TRIGLYCERIDES: Triglycerides: 83 mg/dL (ref <150)

## 2023-06-25 MED ORDER — HEPARIN SODIUM (PORCINE) 5000 UNIT/ML IJ SOLN
5000.0000 [IU] | Freq: Three times a day (TID) | INTRAMUSCULAR | Status: DC
Start: 2023-06-25 — End: 2023-06-27
  Filled 2023-06-25 (×4): qty 1

## 2023-06-25 NOTE — Progress Notes (Signed)
  Progress Note   Patient: Brittany Colon UEA:[id number] DOB: 1961-03-08 DOA: 06/23/2023     2 DOS: the patient was seen and examined on 06/25/2023 at 11:11 AM      Brief hospital course: Brittany Colon is a 62 y.o. F with hx BrCA s/p lumpectomy, mild DM well controlled, and HLD who presented with 1 month epigastric pain and nausea, and now AKI.     Assessment and Plan: * AKI (acute kidney injury) [facility]) Discussed with PCPs office by phone, creatinine baseline in May was 0.74 mg/dL  Cr 47/82 was 2.0 > 2.3 > 2.3 > 2.2 > 2.3 despite 48 hours fluids   Urine bland.  CT rules out hydronephrosis.  Limited OTC analgesics, doubt NSAID nephropathy.  No omeprazole.  FeNA elevated.  Nephrology suspect ATN vs AIN.  UOP good, no sig acidosis or electrolyte abnormality  - Strict I/O - Trend Cr - Likely outpatient follow up and biopsy as indicated by recovery     Suspsected subacute pancreatitis Lipase normal but epigastric pain and hazy pancreas on CT. Given 1 month timeline of pain, atypical for pancreatitis.  However, sypmtoms improve with CLD, then diet challenge on day 1 and again today failed (pain, nausea after eating) - Liquid diet - Repeat Lipase - Consult GI, appreciate expertise - Defer MRCP to GI    Abnormal taste in mouth Patient observed spitting all saliva into a cup today.  Reports she has a bad taste in mouth, can't swallow saliva the last month.  Initially, this was associated with a white film on the tongue and posterior pharynx, sore throat and hoarseness; the hoarseness went away by itself, the sore throat and white spots resolved with Nystatin swish and swallow (prescribed at urgent care). HIV negative.  At present, no signs of oral thrush at all. - Consult GI    Obesity (BMI 30-39.9) BMI 31  Type 2 diabetes mellitus (HCC) A1c well controlled - Hold metformin - Continue SS corrections  Hyperlipidemia - Continue Crestor  Normocytic anemia Unclear cause.   Iron studies, B12 and folate normal.  Seems hypoproliferative.   No bleeding - Trend Hgb        Subjective: Patient had epigastric pain after eating today.  Nephrology seen her and, plan for outpatient follow-up.  No vomiting, no changes to bowel habits, no fever, no confusion, no other new symptoms.     Physical Exam: BP 123/74 (BP Location: Left Arm)   Pulse 86   Temp 97.8 F (36.6 C) (Oral)   Resp 18   Ht 5\' 2"  (1.575 m)   Wt 77.1 kg   SpO2 100%   BMI 31.09 kg/m   Adult female, lying in bed, interactive and appropriate RRR, no murmurs, no peripheral edema Respiratory rate normal, lungs clear without rales or wheezes Attention normal, affect normal, judgment insight appear normal, face symmetric, oriented to person, place, time    Data Reviewed: Discussed with nephrology and GI Patient metabolic panel shows creatinine up to 2.3 CBC shows hemoglobin 8.8, stable  Family Communication: Daughter by phone    Disposition: Status is: Inpatient         Author: Alberteen Sam, MD 06/25/2023 2:14 PM  For on call review [email or web address].

## 2023-06-25 NOTE — Assessment & Plan Note (Signed)
Discussed with PCPs office by phone, creatinine baseline in May was 0.74 mg/dL  Cr 16/10 was 2.0 > 2.3 > 2.3 > 2.2 > 2.3 despite 48 hours fluids   Urine bland.  CT rules out hydronephrosis.  Limited OTC analgesics, doubt NSAID nephropathy.  No omeprazole.  FeNA elevated.  Nephrology suspect ATN vs AIN.  UOP good, no sig acidosis or electrolyte abnormality  - Strict I/O - Trend Cr - Likely outpatient follow up and biopsy as indicated by recovery

## 2023-06-25 NOTE — Assessment & Plan Note (Signed)
BMI 31 °

## 2023-06-25 NOTE — Consult Note (Signed)
[facility] Renal Consultation Note  Requesting MD: Maryfrances Bunnell, MD Indication for Consultation: AKI  HPI:  Brittany Colon is a 61 y.o. female with PMH T2DM, HTN, breast cancer s/p lumpectomy and radiation, HLD who is admitted with AKI after presenting to [facility] ED for evaluation of epigastric pain x1 month.   Dr. Maryfrances Bunnell spoke with patient's PCP who confirmed serum creatinine of 0.74 in May 2024. She has had vomiting 3-4x weekly for about 1 month and when checked 10/21 her serum creatinine had increased to 2. She was advised to increase PO intake and despite this on recheck 1 week later her serum creatinine increased further to 2.3, prompting her to be advised to come to ED for evaluation.Thus far she has received ~4L IVF. Kidneys have normal appearance on imaging. UA unremarkable.  She was recently treated for R otitis media with augmentin but only took about 1 week's worth of medication due to vomiting and concern that it was making this problem worse for her. She has not taken any OTC pain medications including ibuprofen, aleve, tylenol. Meloxicam is on her medication list but she is not taking this medication.  Her brother does have ESRD and required kidney transplant and is HD dependent now but patient believes it was induced by uncontrolled HTN, alcohol. No other personal or family history of kidney disease, autoimmune disease.   Denies history of chest pain, shortness of breath, abnormal swelling/edema.  Past Medical History: Past Medical History:  Diagnosis Date   Allergy    Breast cancer (HCC) 12/14/12   right   Breast cyst 12/18/12   mult bilaterally per MRI   Headache(784.0)    History of radiation therapy 02/09/2013-03/31/2013   62.4 gray to the right breast   Personal history of radiation therapy 2014   Right Breast Cancer   Past Surgical History: Past Surgical History:  Procedure Laterality Date   BREAST BIOPSY Right 2014   BREAST LUMPECTOMY Right 2014    BREAST LUMPECTOMY WITH NEEDLE LOCALIZATION Right 01/10/2013   Procedure: BREAST LUMPECTOMY WITH NEEDLE LOCALIZATION;  Surgeon: Mariella Saa, MD;  Location: [facility];  Service: General;  Laterality: Right;   CARPAL TUNNEL RELEASE Right    FOOT SURGERY  1993   bone spur lt foot   KNEE SURGERY Bilateral    Family History:  Family History  Problem Relation Age of Onset   Heart disease Mother    Social History:  reports that she has never smoked. She does not have any smokeless tobacco history on file. She reports that she does not drink alcohol and does not use drugs.  Allergies:  Allergies  Allergen Reactions   Elemental Sulfur     vomiting   Sulfa Antibiotics    Medications: Prior to Admission medications   Medication Sig Start Date End Date Taking? Authorizing Provider  acetaminophen (TYLENOL) 500 MG tablet Take 1,000 mg by mouth every 8 (eight) hours as needed for mild pain (pain score 1-3) or moderate pain (pain score 4-6).   Yes [provider]  amoxicillin-clavulanate (AUGMENTIN) 875-125 MG tablet Take 1 tablet by mouth 2 (two) times daily. 06/08/23  Yes [provider]  calcium carbonate (SUPER CALCIUM) 1500 (600 Ca) MG TABS tablet Take 600 mg by mouth daily.   Yes [provider]  ibuprofen (ADVIL) 800 MG tablet Take 800 mg by mouth every 8 (eight) hours as needed for mild pain (pain score 1-3) or moderate pain (pain score 4-6).   Yes  [provider]  Melaton-Thean-Cham-PassF-LBalm (MELATONIN + L-THEANINE PO) Take 1 capsule by mouth at bedtime as needed.   Yes [provider]  metFORMIN (GLUCOPHAGE-XR) 500 MG 24 hr tablet Take 500 mg by mouth See admin instructions. Take 1 tablet by mouth with evening meal every day.   Yes [provider]  Multiple Vitamins-Minerals (MULTIVITAMIN PO) Take by mouth daily.   Yes [provider]  ondansetron (ZOFRAN) 8 MG tablet Take 1 tablet (8 mg total) by mouth  every 8 (eight) hours as needed for nausea or vomiting. 07/07/18  Yes Rodriguez-Southworth, Nettie Elm, PA-C  pantoprazole (PROTONIX) 40 MG tablet Take 40 mg by mouth 2 (two) times daily. 06/15/23  Yes [provider]  rosuvastatin (CRESTOR) 20 MG tablet Take 1 tablet by mouth daily. 03/18/21  Yes [provider]  nystatin (MYCOSTATIN) 100000 UNIT/ML suspension Take 5 mLs by mouth 4 (four) times daily. 06/08/23   [provider]    I have reviewed the patient's current medications.  Labs:  Results for orders placed or performed during the hospital encounter of 06/23/23 (from the past 48 hour(s))  Basic metabolic panel     Status: Abnormal   Collection Time: 06/23/23  1:44 PM  Result Value Ref Range   Sodium 142 135 - 145 mmol/L   Potassium 3.6 3.5 - 5.1 mmol/L   Chloride 109 98 - 111 mmol/L   CO2 19 (L) 22 - 32 mmol/L   Glucose, Bld 80 70 - 99 mg/dL    Comment: Glucose reference range applies only to samples taken after fasting for at least 8 hours.   BUN 21 8 - 23 mg/dL   Creatinine, Ser 0.98 (H) 0.44 - 1.00 mg/dL   Calcium 9.2 8.9 - 11.9 mg/dL   GFR, Estimated 23 (L) >60 mL/min    Comment: (NOTE) Calculated using the CKD-EPI Creatinine Equation (2021)    Anion gap 14 5 - 15    Comment: Performed at [facility], [address] [place], [place] [address]  CBC     Status: Abnormal   Collection Time: 06/23/23  1:44 PM  Result Value Ref Range   WBC 9.4 4.0 - 10.5 K/uL   RBC 3.72 (L) 3.87 - 5.11 MIL/uL   Hemoglobin 10.1 (L) 12.0 - 15.0 g/dL   HCT 95.6 (L) 21.3 - 08.6 %   MCV 87.4 80.0 - 100.0 fL   MCH 27.2 26.0 - 34.0 pg   MCHC 31.1 30.0 - 36.0 g/dL   RDW 57.8 46.9 - 62.9 %   Platelets 262 150 - 400 K/uL   nRBC 0.0 0.0 - 0.2 %    Comment: Performed at [facility], [address], [place], [place] [address]  Hepatic function panel     Status: Abnormal   Collection Time: 06/23/23  1:44 PM  Result Value Ref Range   Total Protein 8.1 6.5 - 8.1  g/dL   Albumin 3.1 (L) 3.5 - 5.0 g/dL   AST 19 15 - 41 U/L   ALT 15 0 - 44 U/L   Alkaline Phosphatase 74 38 - 126 U/L   Total Bilirubin 0.1 (L) 0.3 - 1.2 mg/dL   Bilirubin, Direct <3.2 0.0 - 0.2 mg/dL   Indirect Bilirubin NOT CALCULATED 0.3 - 0.9 mg/dL    Comment: Performed at [facility], [address] [place], [place] [address]  Lipase, blood     Status: None   Collection Time: 06/23/23  1:44 PM  Result Value Ref Range   Lipase 38 11 - 51 U/L    Comment: Performed at [facility], [address], [place], [place] [address]  Urinalysis, Routine w reflex microscopic -Urine, Clean Catch     Status: Abnormal   Collection Time: 06/23/23  8:00 PM  Result Value Ref Range   Color, Urine STRAW (A) YELLOW   APPearance CLEAR CLEAR   Specific Gravity, Urine 1.005 1.005 - 1.030   pH 6.0 5.0 - 8.0   Glucose, UA NEGATIVE NEGATIVE mg/dL   Hgb urine dipstick NEGATIVE NEGATIVE   Bilirubin Urine NEGATIVE NEGATIVE   Ketones, ur NEGATIVE NEGATIVE mg/dL   Protein, ur NEGATIVE NEGATIVE mg/dL   Nitrite NEGATIVE NEGATIVE   Leukocytes,Ua SMALL (A) NEGATIVE   RBC / HPF 0-5 0 - 5 RBC/hpf   WBC, UA 0-5 0 - 5 WBC/hpf   Bacteria, UA RARE (A) NONE SEEN   Squamous Epithelial / HPF 0-5 0 - 5 /HPF    Comment: Performed at [facility], [address], [place], [place] [address]  HIV Antibody (routine testing w rflx)     Status: None   Collection Time: 06/23/23  9:00 PM  Result Value Ref Range   HIV Screen 4th Generation wRfx Non Reactive Non Reactive    Comment: Performed at [facility], [address], [place], [place] [address]  Hemoglobin A1c     Status: Abnormal   Collection Time: 06/23/23  9:00 PM  Result Value Ref Range   Hgb A1c MFr Bld 6.4 (H) 4.8 - 5.6 %    Comment: (NOTE) Pre diabetes:          5.7%-6.4%  Diabetes:              >6.4%  Glycemic control for   <7.0% adults with diabetes    Mean Plasma Glucose 136.98 mg/dL    Comment: Performed at [facility], [address] [place], [place] [address]  Glucose, capillary     Status: None   Collection Time: 06/23/23  9:01 PM  Result Value Ref Range   Glucose-Capillary 77 70 - 99 mg/dL    Comment: Glucose reference range applies only to samples taken after fasting for at least 8 hours.  Vitamin B12     Status: None   Collection Time: 06/24/23  7:04 AM  Result Value Ref Range   Vitamin B-12 761 180 - 914 pg/mL    Comment: (NOTE) This assay is not validated for testing neonatal or myeloproliferative syndrome specimens for Vitamin B12 levels. Performed at [facility], [address] [place], [place] [address]   Folate     Status: None   Collection Time: 06/24/23  7:04 AM  Result Value Ref Range   Folate 20.7 >5.9 ng/mL    Comment: Performed at [facility], [address] [place], [place] [address]  Iron and TIBC     Status: Abnormal   Collection Time: 06/24/23  7:04 AM  Result Value Ref Range   Iron 25 (L) 28 - 170 ug/dL   TIBC 595 (L) 638 - 756 ug/dL   Saturation Ratios 12 10.4 - 31.8 %   UIBC 192 ug/dL    Comment: Performed at [facility], [address], [place], [place] [address]  Ferritin     Status: None   Collection Time: 06/24/23  7:04 AM  Result Value Ref Range   Ferritin 204 11 -  307 ng/mL    Comment: Performed at [facility], [address], [place], [place] [address]  Reticulocytes     Status: Abnormal   Collection Time: 06/24/23  7:04 AM  Result Value Ref Range   Retic Ct Pct 0.8 0.4 - 3.1 %   RBC. 3.18 (L) 3.87 - 5.11 MIL/uL   Retic Count, Absolute 25.4 19.0 - 186.0 K/uL   Immature Retic Fract 9.8 2.3 - 15.9 %    Comment: Performed at [facility], [address], [place], [place] [address]  Basic metabolic panel     Status: Abnormal   Collection Time: 06/24/23  7:04 AM  Result Value Ref Range   Sodium 139 135 - 145 mmol/L   Potassium 3.3 (L) 3.5 - 5.1 mmol/L   Chloride 114 (H) 98 - 111 mmol/L   CO2 21 (L) 22 - 32 mmol/L    Glucose, Bld 95 70 - 99 mg/dL    Comment: Glucose reference range applies only to samples taken after fasting for at least 8 hours.   BUN 16 8 - 23 mg/dL   Creatinine, Ser 5.78 (H) 0.44 - 1.00 mg/dL   Calcium 8.4 (L) 8.9 - 10.3 mg/dL   GFR, Estimated 24 (L) >60 mL/min    Comment: (NOTE) Calculated using the CKD-EPI Creatinine Equation (2021)    Anion gap 4 (L) 5 - 15    Comment: Performed at [facility], [address] [place], [place] [address]  Glucose, capillary     Status: None   Collection Time: 06/24/23  7:31 AM  Result Value Ref Range   Glucose-Capillary 98 70 - 99 mg/dL    Comment: Glucose reference range applies only to samples taken after fasting for at least 8 hours.  Protein / creatinine ratio, urine     Status: Abnormal   Collection Time: 06/24/23 11:38 AM  Result Value Ref Range   Creatinine, Urine 29 mg/dL   Total Protein, Urine 20 mg/dL    Comment: NO NORMAL RANGE ESTABLISHED FOR THIS TEST   Protein Creatinine Ratio 0.69 (H) 0.00 - 0.15 mg/mg[Cre]    Comment: Performed at [facility], [address] [place], [place] [address]  Sodium, urine, random     Status: None   Collection Time: 06/24/23 11:38 AM  Result Value Ref Range   Sodium, Ur 56 mmol/L    Comment: Performed at [facility], [address], [place], [place] [address]  Glucose, capillary     Status: Abnormal   Collection Time: 06/24/23 12:32 PM  Result Value Ref Range   Glucose-Capillary 118 (H) 70 - 99 mg/dL    Comment: Glucose reference range applies only to samples taken after fasting for at least 8 hours.  Glucose, capillary     Status: Abnormal   Collection Time: 06/24/23  4:24 PM  Result Value Ref Range   Glucose-Capillary 115 (H) 70 - 99 mg/dL    Comment: Glucose reference range applies only to samples taken after fasting for at least 8 hours.  Glucose, capillary     Status: None   Collection Time: 06/24/23  9:32 PM  Result Value Ref Range   Glucose-Capillary 70 70 -  99 mg/dL    Comment: Glucose reference range applies only to samples taken after fasting for at least 8 hours.  CBC     Status: Abnormal   Collection Time: 06/25/23  4:35 AM  Result Value Ref Range   WBC  9.0 4.0 - 10.5 K/uL   RBC 3.18 (L) 3.87 - 5.11 MIL/uL   Hemoglobin 8.8 (L) 12.0 - 15.0 g/dL   HCT 16.1 (L) 09.6 - 04.5 %   MCV 86.8 80.0 - 100.0 fL   MCH 27.7 26.0 - 34.0 pg   MCHC 31.9 30.0 - 36.0 g/dL   RDW 40.9 81.1 - 91.4 %   Platelets 230 150 - 400 K/uL   nRBC 0.0 0.0 - 0.2 %    Comment: Performed at [facility], [address], [place], [place] [address]  Basic metabolic panel     Status: Abnormal   Collection Time: 06/25/23  4:35 AM  Result Value Ref Range   Sodium 145 135 - 145 mmol/L   Potassium 3.9 3.5 - 5.1 mmol/L   Chloride 117 (H) 98 - 111 mmol/L   CO2 20 (L) 22 - 32 mmol/L   Glucose, Bld 102 (H) 70 - 99 mg/dL    Comment: Glucose reference range applies only to samples taken after fasting for at least 8 hours.   BUN 13 8 - 23 mg/dL   Creatinine, Ser 6.21 (H) 0.44 - 1.00 mg/dL   Calcium 8.8 (L) 8.9 - 10.3 mg/dL   GFR, Estimated 23 (L) >60 mL/min    Comment: (NOTE) Calculated using the CKD-EPI Creatinine Equation (2021)    Anion gap 8 5 - 15    Comment: Performed at [facility], [address], [place], [place] [address]  Glucose, capillary     Status: None   Collection Time: 06/25/23  7:25 AM  Result Value Ref Range   Glucose-Capillary 91 70 - 99 mg/dL    Comment: Glucose reference range applies only to samples taken after fasting for at least 8 hours.   ROS: Pertinent items are noted in HPI.  Physical Exam: Vitals:   06/25/23 0451 06/25/23 0921  BP: (!) 141/79 123/74  Pulse: 80 86  Resp: 18 18  Temp: 98.7 F (37.1 C) 97.8 F (36.6 C)  SpO2: 98% 100%     Constitutional:Well-appearing, seated in bedside recliner. In no acute distress. Cardio:Regular rate and rhythm. No murmurs, rubs, or gallops. Trace pitting edema to bilateral  LE. Pulm:Clear to auscultation bilaterally. Normal work of breathing on room air. Abdomen:Soft, non-distended, tender to palpation over epigastrium. Skin:Warm and dry. Neuro:Alert and oriented x3. No focal deficit noted. Psych:Pleasant mood and affect.  Labs Reviewed: CO2 20 BUN 13/Cr 2.35 (16/2.27) Ca 8.8 GFR 23 Hgb 8.8 HCT 27.6 Iron 25 TIBC 217 UNa 56 Protein/creatinine ratio 0.69  I's & O's: Net +4L, no documented UOP  Assessment/Plan:  AKI: Suspect prerenal in setting of GI illness and poor PO intake complicated by possible progression to ATN, cannot rule out AIN. Imaging unremarkable. UA at admission also WNL. Has received ~4L IVF without improvement. Is making urine, it is just not being measured. Did receive ~1 week of augmentin for R otitis media which was given preceding abnormal lab results. She is on protonix which as started after lab abnormalities were first noticed, but this medication is new and she has only been taking it for about 1 week.  Encourage PO intake, no longer needs IVF Recommend switching from PPI to H2B; PPI d/ced at this time Avoid nephrotoxic agents No indication for biopsy at this time but would consider if no improvement of renal function with time and supportive care as above Metabolic acidosis: Mild with bicarbonate of 20, no anion gap. Suspect related to GI  losses versus large volume IVF resuscitation. Normocytic anemia: No signs of active bleed. Iron studies are indeterminate. Per primary. Volume/HTN: No history of CHF, not on anti-hypertensives as OP. BP stable at this time, clinically with trace pitting edema, which she reports not normally having. BP WNL.   Champ Mungo, DO Internal Medicine PGY-3 06/25/2023, 10:09 AM

## 2023-06-25 NOTE — Assessment & Plan Note (Signed)
Patient observed spitting all saliva into a cup today.  Reports she has a bad taste in mouth, can't swallow saliva the last month.  Initially, this was associated with a white film on the tongue and posterior pharynx, sore throat and hoarseness; the hoarseness went away by itself, the sore throat and white spots resolved with Nystatin swish and swallow (prescribed at urgent care). HIV negative.  At present, no signs of oral thrush at all. - Consult GI

## 2023-06-25 NOTE — H&P (View-Only) (Signed)
 Consultation  Referring Provider: [facility]/ [place] Primary Care Physician:  Georgianne Fick, MD Primary Gastroenterologist:  unassigned  Reason for Consultation: Epigastric pain, weight loss, regurgitation, abnormal CT  HPI: Brittany Colon is a 62 y.o. female, with history of breast cancer, status post lumpectomy and radiation 2014, history of adult onset diabetes mellitus, hyperlipidemia. She was admitted 2 days ago after repeat labs done by her PCP Dr. Nicholos Johns  and had shown persistent acute kidney injury. Patient says she has not had any prior issues with her kidneys. She says her current symptoms started about a month ago when she started feeling just generally unwell and then had a sore throat, which was associated with nausea and vomiting that she was seen in urgent care diagnosed with an ear infection and was given a course of Augmentin. She followed up with her PCP after that because she was continuing to feel poorly with nausea and epigastric pain and she was given another medication which I believe was a PPI, however she cannot name this.  She had labs done at that time that showed acute kidney injury.  Those labs were repeated a week or so later, and after those returned she was asked to come to the emergency room. She has not had any fever chills or sweats, no changes with her bowel habits, no melena or hematochezia.  She relates ongoing persistent constant epigastric pain over the past month which at times is burning and other times just feels like a pain.  It is generally exacerbated by eating.  She is able to swallow without difficulty, but does get a full feeling in her lower chest in her epigastrium with eating.  She says she does better with liquids than with solids but there is no distinct dysphagia or odynophagia.  She has started spitting out her saliva and when asked why she is doing this she says because if she continues to swallow the saliva because it is thick it  would eventually make her feel more nauseated and bring on an episode of vomiting. Had associated 20 pound weight loss since onset of symptoms.  She says initially in the late summer she was trying to lose weight, she had not been on any weight loss medication etc. but then the weight started dropping off when the symptoms started.  She has not been any on any other new medications.  No EtOH, no NSAIDs.  On admission she had CT of the abdomen and pelvis done without contrast that shows possible mild pancreatic edema and possible subtle haziness of the peripancreatic fat, fibroid uterus.  Admitting labs with WBC 9.4/hemoglobin 10.1/Mehta crit 32.5/MCV 87 platelets 262 Sodium 142/potassium 3.6/BUN 21/creatinine 2.38 Albumin 3.1 LFTs within normal limits Lipase 38 Globin A1c 6.4 B12 and folate within normal limits Iron 25/TIBC 217/iron sat12/ferritin 204  Today, WBC 9.0/hemoglobin 8.8/hematocrit 27.6 BUN 13/creatinine 2.35/GFR 23  Nephrology consulted today-differential includes AIN, ATN the recommendation to continue with supportive care, hold beta-lactam.  Recommended if far does not recover with supportive management may need kidney biopsy.   Past Medical History:  Diagnosis Date   Allergy    Breast cancer (HCC) 12/14/12   right   Breast cyst 12/18/12   mult bilaterally per MRI   Headache(784.0)    History of radiation therapy 02/09/2013-03/31/2013   62.4 gray to the right breast   Personal history of radiation therapy 2014   Right Breast Cancer    Past Surgical History:  Procedure Laterality Date  BREAST BIOPSY Right 2014   BREAST LUMPECTOMY Right 2014   BREAST LUMPECTOMY WITH NEEDLE LOCALIZATION Right 01/10/2013   Procedure: BREAST LUMPECTOMY WITH NEEDLE LOCALIZATION;  Surgeon: Mariella Saa, MD;  Location:  [facility];  Service: General;  Laterality: Right;   CARPAL TUNNEL RELEASE Right    FOOT SURGERY  1993   bone spur lt foot   KNEE SURGERY Bilateral      Prior to Admission medications   Medication Sig Start Date End Date Taking? Authorizing Provider  acetaminophen (TYLENOL) 500 MG tablet Take 1,000 mg by mouth every 8 (eight) hours as needed for mild pain (pain score 1-3) or moderate pain (pain score 4-6).   Yes [provider]  amoxicillin-clavulanate (AUGMENTIN) 875-125 MG tablet Take 1 tablet by mouth 2 (two) times daily. 06/08/23  Yes [provider]  calcium carbonate (SUPER CALCIUM) 1500 (600 Ca) MG TABS tablet Take 600 mg by mouth daily.   Yes [provider]  ibuprofen (ADVIL) 800 MG tablet Take 800 mg by mouth every 8 (eight) hours as needed for mild pain (pain score 1-3) or moderate pain (pain score 4-6).   Yes [provider]  Melaton-Thean-Cham-PassF-LBalm (MELATONIN + L-THEANINE PO) Take 1 capsule by mouth at bedtime as needed.   Yes [provider]  metFORMIN (GLUCOPHAGE-XR) 500 MG 24 hr tablet Take 500 mg by mouth See admin instructions. Take 1 tablet by mouth with evening meal every day.   Yes [provider]  Multiple Vitamins-Minerals (MULTIVITAMIN PO) Take by mouth daily.   Yes [provider]  ondansetron (ZOFRAN) 8 MG tablet Take 1 tablet (8 mg total) by mouth every 8 (eight) hours as needed for nausea or vomiting. 07/07/18  Yes Rodriguez-Southworth, Nettie Elm, PA-C  pantoprazole (PROTONIX) 40 MG tablet Take 40 mg by mouth 2 (two) times daily. 06/15/23  Yes [provider]  rosuvastatin (CRESTOR) 20 MG tablet Take 1 tablet by mouth daily. 03/18/21  Yes [provider]  nystatin (MYCOSTATIN) 100000 UNIT/ML suspension Take 5 mLs by mouth 4 (four) times daily. 06/08/23   [provider]    Current Facility-Administered Medications  Medication Dose Route Frequency Provider Last Rate Last Admin   acetaminophen (TYLENOL) tablet 650 mg  650 mg Oral Q6H PRN John Giovanni, MD       Or   acetaminophen (TYLENOL) suppository 650 mg  650 mg  Rectal Q6H PRN John Giovanni, MD       diazepam (VALIUM) tablet 5 mg  5 mg Oral Once PRN Danford, Earl Lites, MD       heparin injection 5,000 Units  5,000 Units Subcutaneous Q8H Amorie Rentz S, PA-C       insulin aspart (novoLOG) injection 0-5 Units  0-5 Units Subcutaneous QHS John Giovanni, MD       insulin aspart (novoLOG) injection 0-9 Units  0-9 Units Subcutaneous TID WC John Giovanni, MD       rosuvastatin (CRESTOR) tablet 20 mg  20 mg Oral Daily John Giovanni, MD   20 mg at 06/25/23 8295    Allergies as of 06/23/2023 - Review Complete 06/23/2023  Allergen Reaction Noted   Elemental sulfur  12/22/2012   Sulfa antibiotics  06/23/2023    Family History  Problem Relation Age of Onset   Heart disease Mother     Social History   Socioeconomic History   Marital status: Married    Spouse name: Not on file   Number of children: 2   Years of  education: Not on file   Highest education level: Not on file  Occupational History    Employer: [employer]  Tobacco Use   Smoking status: Never   Smokeless tobacco: Not on file  Substance and Sexual Activity   Alcohol use: No   Drug use: No   Sexual activity: Yes    Comment: menarche 35, P2, 1st birth age 60, no BCP  Other Topics Concern   Not on file  Social History Narrative   Not on file   Social Determinants of Health   Financial Resource Strain: Not on file  Food Insecurity: No Food Insecurity (06/23/2023)   Hunger Vital Sign    Worried About Running Out of Food in the Last Year: Never true    Ran Out of Food in the Last Year: Never true  Transportation Needs: No Transportation Needs (06/23/2023)   PRAPARE - Administrator, Civil Service (Medical): No    Lack of Transportation (Non-Medical): No  Physical Activity: Not on file  Stress: Not on file  Social Connections: Unknown (01/06/2022)   Received from [facility], [facility]   Social Network    Social Network: Not  on file  Intimate Partner Violence: Not At Risk (06/23/2023)   Humiliation, Afraid, Rape, and Kick questionnaire    Fear of Current or Ex-Partner: No    Emotionally Abused: No    Physically Abused: No    Sexually Abused: No    Review of Systems: Pertinent positive and negative review of systems were noted in the above HPI section.  All other review of systems was otherwise negative.   Physical Exam: Vital signs in last 24 hours: Temp:  [97.8 F (36.6 C)-98.8 F (37.1 C)] 97.8 F (36.6 C) (10/31 0921) Pulse Rate:  [80-92] 86 (10/31 0921) Resp:  [18] 18 (10/31 0921) BP: (123-142)/(72-87) 123/74 (10/31 0921) SpO2:  [98 %-100 %] 100 % (10/31 4782)   General:   Alert,  Well-developed, well-nourished, older African-American female ,pleasant and cooperative in NAD.  Sitting up in chair, son at bedside Head:  Normocephalic and atraumatic. Eyes:  Sclera clear, no icterus.   Conjunctiva pink. Ears:  Normal auditory acuity. Nose:  No deformity, discharge,  or lesions. Mouth:  No deformity or lesions.   Neck:  Supple; no masses or thyromegaly. Lungs:  Clear throughout to auscultation.   No wheezes, crackles, or rhonchi.  Heart:  Regular rate and rhythm; no murmurs, clicks, rubs,  or gallops. Abdomen:  Soft, she is tender in the epigastrium, no rebound, BS active,nonpalp mass or hsm.   Rectal: Not done Msk:  Symmetrical without gross deformities. . Pulses:  Normal pulses noted. Extremities:  Without clubbing or edema. Neurologic:  Alert and  oriented x4;  grossly normal neurologically. Skin:  Intact without significant lesions or rashes.. Psych:  Alert and cooperative. Normal mood and affect.  Intake/Output from previous day: 10/30 0701 - 10/31 0700 In: 1676.1 [P.O.:720; I.V.:956.1] Out: 0  Intake/Output this shift: Total I/O In: 240 [P.O.:240] Out: 0   Lab Results: Recent Labs    06/23/23 1344 06/25/23 0435  WBC 9.4 9.0  HGB 10.1* 8.8*  HCT 32.5* 27.6*  PLT 262 230    BMET Recent Labs    06/23/23 1344 06/24/23 0704 06/25/23 0435  NA 142 139 145  K 3.6 3.3* 3.9  CL 109 114* 117*  CO2 19* 21* 20*  GLUCOSE 80 95 102*  BUN 21 16 13   CREATININE 2.38* 2.27* 2.35*  CALCIUM 9.2 8.4* 8.8*   LFT Recent Labs    06/23/23 1344  PROT 8.1  ALBUMIN 3.1*  AST 19  ALT 15  ALKPHOS 74  BILITOT 0.1*  BILIDIR <0.1  IBILI NOT CALCULATED   PT/INR No results for input(s): "LABPROT", "INR" in the last 72 hours. Hepatitis Panel No results for input(s): "HEPBSAG", "HCVAB", "HEPAIGM", "HEPBIGM" in the last 72 hours.    IMPRESSION:  [age] year old African-American female with 1 month history of acute/subacute illness initially onset with general sense of unwellness, then sore throat nausea and vomiting. Initial diagnosis of ear infection and treated with Augmentin After seeing PCP found to have evidence of acute kidney injury etiology not clear-this persisted and with repeat labs was recommended to be admitted  She now relates ongoing epigastric pain which has been constant over the past month, and has been associated with a 20 pound weight loss.  No discrete dysphagia or odynophagia but definite early satiety type symptoms and is spitting her saliva frequently because she gets the sense that if it builds up it will increase likelihood of vomiting  Noncontrasted CT scan on admission raised possibility of haziness in the peripancreatic fat and perhaps mild pancreatic edema. However lipase normal on admission as her LFTs  Etiology of her above symptoms is not entirely clear, definitely contrasted imaging with MRI/MRCP will be helpful however this cannot be done until her creatinine clearance is greater than 30  Concerned about underlying malignancy, and also need to consider peptic ulcer disease/gastric lesion/penetrating ulcer with associated mild pancreatic inflammation  #2 acute kidney injury related to above-has had poor oral intake, however thus far renal  function has not recovered with fluids  #3 adult onset diabetes mellitus, on oral agent #4 hyperlipidemia-on Crestor  #5 anemia, normocytic-B12 and folate normal, iron studies not consistent with iron deficiency   PLAN: Okay to try full liquids this evening, will keep n.p.o. after midnight Hold off on starting PPI for now Patient has been scheduled for EGD with Dr. Rhea Belton tomorrow AM.  Procedure was discussed in detail with the patient including indications risks and benefits and she is agreeable to proceed.  Hopefully this may help clarify underlying pathology  Will hope that kidney function will recover enough over the next few days to pursue MRI  IgG4 was added and triglyceride level Will check sed rate and CRP  Further Recommendations pending findings at EGD     Gio Janoski EsterwoodPA-C  06/25/2023, 4:03 PM

## 2023-06-25 NOTE — Assessment & Plan Note (Signed)
A1c well controlled - Hold metformin - Continue SS corrections

## 2023-06-25 NOTE — Plan of Care (Signed)
  Problem: Education: Goal: Ability to describe self-care measures that may prevent or decrease complications (Diabetes Survival Skills Education) will improve Outcome: Progressing Goal: Individualized Educational Video(s) Outcome: Progressing   Problem: Coping: Goal: Ability to adjust to condition or change in health will improve Outcome: Progressing   Problem: Fluid Volume: Goal: Ability to maintain a balanced intake and output will improve Outcome: Progressing   Problem: Health Behavior/Discharge Planning: Goal: Ability to identify and utilize available resources and services will improve Outcome: Progressing Goal: Ability to manage health-related needs will improve Outcome: Progressing   Problem: Metabolic: Goal: Ability to maintain appropriate glucose levels will improve Outcome: Progressing   Problem: Nutritional: Goal: Maintenance of adequate nutrition will improve Outcome: Progressing Goal: Progress toward achieving an optimal weight will improve Outcome: Progressing   Problem: Skin Integrity: Goal: Risk for impaired skin integrity will decrease Outcome: Progressing   Problem: Tissue Perfusion: Goal: Adequacy of tissue perfusion will improve Outcome: Progressing   Problem: Education: Goal: Knowledge of General Education information will improve Description: Including pain rating scale, medication(s)/side effects and non-pharmacologic comfort measures Outcome: Progressing   Problem: Health Behavior/Discharge Planning: Goal: Ability to manage health-related needs will improve Outcome: Progressing   Problem: Clinical Measurements: Goal: Ability to maintain clinical measurements within normal limits will improve Outcome: Progressing Goal: Will remain free from infection Outcome: Progressing Goal: Diagnostic test results will improve Outcome: Progressing Goal: Respiratory complications will improve Outcome: Progressing Goal: Cardiovascular complication will  be avoided Outcome: Progressing   Problem: Activity: Goal: Risk for activity intolerance will decrease Outcome: Progressing   Problem: Nutrition: Goal: Adequate nutrition will be maintained Outcome: Progressing   Problem: Coping: Goal: Level of anxiety will decrease Outcome: Progressing   Problem: Elimination: Goal: Will not experience complications related to bowel motility Outcome: Progressing Goal: Will not experience complications related to urinary retention Outcome: Progressing   Problem: Pain Management: Goal: General experience of comfort will improve Outcome: Progressing   Problem: Safety: Goal: Ability to remain free from injury will improve Outcome: Progressing   Problem: Skin Integrity: Goal: Risk for impaired skin integrity will decrease Outcome: Progressing   Problem: Education: Goal: Knowledge of disease and its progression will improve Outcome: Progressing   Problem: Health Behavior/Discharge Planning: Goal: Ability to manage health-related needs will improve Outcome: Progressing   Problem: Clinical Measurements: Goal: Complications related to the disease process or treatment will be avoided or minimized Outcome: Progressing Goal: Dialysis access will remain free of complications Outcome: Progressing   Problem: Activity: Goal: Activity intolerance will improve Outcome: Progressing   Problem: Fluid Volume: Goal: Fluid volume balance will be maintained or improved Outcome: Progressing   Problem: Nutritional: Goal: Ability to make appropriate dietary choices will improve Outcome: Progressing   Problem: Respiratory: Goal: Respiratory symptoms related to disease process will be avoided Outcome: Progressing   Problem: Self-Concept: Goal: Body image disturbance will be avoided or minimized Outcome: Progressing   Problem: Urinary Elimination: Goal: Progression of disease will be identified and treated Outcome: Progressing

## 2023-06-25 NOTE — Assessment & Plan Note (Signed)
Continue Crestor 

## 2023-06-25 NOTE — Assessment & Plan Note (Signed)
Lipase normal but epigastric pain and hazy pancreas on CT. Given 1 month timeline of pain, atypical for pancreatitis.  However, sypmtoms improve with CLD, then diet challenge on day 1 and again today failed (pain, nausea after eating) - Liquid diet - Repeat Lipase - Consult GI, appreciate expertise - Defer MRCP to GI

## 2023-06-25 NOTE — Consult Note (Signed)
Consultation  Referring Provider: [facility]/ [place] Primary Care Physician:  Georgianne Fick, MD Primary Gastroenterologist:  unassigned  Reason for Consultation: Epigastric pain, weight loss, regurgitation, abnormal CT  HPI: Brittany Colon is a 62 y.o. female, with history of breast cancer, status post lumpectomy and radiation 2014, history of adult onset diabetes mellitus, hyperlipidemia. She was admitted 2 days ago after repeat labs done by her PCP Dr. Nicholos Johns  and had shown persistent acute kidney injury. Patient says she has not had any prior issues with her kidneys. She says her current symptoms started about a month ago when she started feeling just generally unwell and then had a sore throat, which was associated with nausea and vomiting that she was seen in urgent care diagnosed with an ear infection and was given a course of Augmentin. She followed up with her PCP after that because she was continuing to feel poorly with nausea and epigastric pain and she was given another medication which I believe was a PPI, however she cannot name this.  She had labs done at that time that showed acute kidney injury.  Those labs were repeated a week or so later, and after those returned she was asked to come to the emergency room. She has not had any fever chills or sweats, no changes with her bowel habits, no melena or hematochezia.  She relates ongoing persistent constant epigastric pain over the past month which at times is burning and other times just feels like a pain.  It is generally exacerbated by eating.  She is able to swallow without difficulty, but does get a full feeling in her lower chest in her epigastrium with eating.  She says she does better with liquids than with solids but there is no distinct dysphagia or odynophagia.  She has started spitting out her saliva and when asked why she is doing this she says because if she continues to swallow the saliva because it is thick it  would eventually make her feel more nauseated and bring on an episode of vomiting. Had associated 20 pound weight loss since onset of symptoms.  She says initially in the late summer she was trying to lose weight, she had not been on any weight loss medication etc. but then the weight started dropping off when the symptoms started.  She has not been any on any other new medications.  No EtOH, no NSAIDs.  On admission she had CT of the abdomen and pelvis done without contrast that shows possible mild pancreatic edema and possible subtle haziness of the peripancreatic fat, fibroid uterus.  Admitting labs with WBC 9.4/hemoglobin 10.1/Mehta crit 32.5/MCV 87 platelets 262 Sodium 142/potassium 3.6/BUN 21/creatinine 2.38 Albumin 3.1 LFTs within normal limits Lipase 38 Globin A1c 6.4 B12 and folate within normal limits Iron 25/TIBC 217/iron sat12/ferritin 204  Today, WBC 9.0/hemoglobin 8.8/hematocrit 27.6 BUN 13/creatinine 2.35/GFR 23  Nephrology consulted today-differential includes AIN, ATN the recommendation to continue with supportive care, hold beta-lactam.  Recommended if far does not recover with supportive management may need kidney biopsy.   Past Medical History:  Diagnosis Date   Allergy    Breast cancer (HCC) 12/14/12   right   Breast cyst 12/18/12   mult bilaterally per MRI   Headache(784.0)    History of radiation therapy 02/09/2013-03/31/2013   62.4 gray to the right breast   Personal history of radiation therapy 2014   Right Breast Cancer    Past Surgical History:  Procedure Laterality Date  BREAST BIOPSY Right 2014   BREAST LUMPECTOMY Right 2014   BREAST LUMPECTOMY WITH NEEDLE LOCALIZATION Right 01/10/2013   Procedure: BREAST LUMPECTOMY WITH NEEDLE LOCALIZATION;  Surgeon: Mariella Saa, MD;  Location:  SUR[facility]ERY [facility];  Service: General;  Laterality: Right;   CARPAL TUNNEL RELEASE Right    FOOT SURGERY  1993   bone spur lt foot   KNEE SURGERY Bilateral      Prior to Admission medications   Medication Sig Start Date End Date Taking? Authorizing Provider  acetaminophen (TYLENOL) 500 MG tablet Take 1,000 mg by mouth every 8 (eight) hours as needed for mild pain (pain score 1-3) or moderate pain (pain score 4-6).   Yes [provider]  amoxicillin-clavulanate (AUGMENTIN) 875-125 MG tablet Take 1 tablet by mouth 2 (two) times daily. 06/08/23  Yes [provider]  calcium carbonate (SUPER CALCIUM) 1500 (600 Ca) MG TABS tablet Take 600 mg by mouth daily.   Yes [provider]  ibuprofen (ADVIL) 800 MG tablet Take 800 mg by mouth every 8 (eight) hours as needed for mild pain (pain score 1-3) or moderate pain (pain score 4-6).   Yes [provider]  Melaton-Thean-Cham-PassF-LBalm (MELATONIN + L-THEANINE PO) Take 1 capsule by mouth at bedtime as needed.   Yes [provider]  metFORMIN (GLUCOPHAGE-XR) 500 MG 24 hr tablet Take 500 mg by mouth See admin instructions. Take 1 tablet by mouth with evening meal every day.   Yes [provider]  Multiple Vitamins-Minerals (MULTIVITAMIN PO) Take by mouth daily.   Yes [provider]  ondansetron (ZOFRAN) 8 MG tablet Take 1 tablet (8 mg total) by mouth every 8 (eight) hours as needed for nausea or vomiting. 07/07/18  Yes Rodriguez-Southworth, Nettie Elm, PA-C  pantoprazole (PROTONIX) 40 MG tablet Take 40 mg by mouth 2 (two) times daily. 06/15/23  Yes [provider]  rosuvastatin (CRESTOR) 20 MG tablet Take 1 tablet by mouth daily. 03/18/21  Yes [provider]  nystatin (MYCOSTATIN) 100000 UNIT/ML suspension Take 5 mLs by mouth 4 (four) times daily. 06/08/23   [provider]    Current Facility-Administered Medications  Medication Dose Route Frequency Provider Last Rate Last Admin   acetaminophen (TYLENOL) tablet 650 mg  650 mg Oral Q6H PRN John Giovanni, MD       Or   acetaminophen (TYLENOL) suppository 650 mg  650 mg  Rectal Q6H PRN John Giovanni, MD       diazepam (VALIUM) tablet 5 mg  5 mg Oral Once PRN Danford, Earl Lites, MD       heparin injection 5,000 Units  5,000 Units Subcutaneous Q8H Amorie Rentz S, PA-C       insulin aspart (novoLOG) injection 0-5 Units  0-5 Units Subcutaneous QHS John Giovanni, MD       insulin aspart (novoLOG) injection 0-9 Units  0-9 Units Subcutaneous TID WC John Giovanni, MD       rosuvastatin (CRESTOR) tablet 20 mg  20 mg Oral Daily John Giovanni, MD   20 mg at 06/25/23 8295    Allergies as of 06/23/2023 - Review Complete 06/23/2023  Allergen Reaction Noted   Elemental sulfur  12/22/2012   Sulfa antibiotics  06/23/2023    Family History  Problem Relation Age of Onset   Heart disease Mother     Social History   Socioeconomic History   Marital status: Married    Spouse name: Not on file   Number of children: 2   Years of  education: Not on file   Highest education level: Not on file  Occupational History    Employer: [employer]  Tobacco Use   Smoking status: Never   Smokeless tobacco: Not on file  Substance and Sexual Activity   Alcohol use: No   Drug use: No   Sexual activity: Yes    Comment: menarche 35, P2, 1st birth age 60, no BCP  Other Topics Concern   Not on file  Social History Narrative   Not on file   Social Determinants of Health   Financial Resource Strain: Not on file  Food Insecurity: No Food Insecurity (06/23/2023)   Hunger Vital Sign    Worried About Running Out of Food in the Last Year: Never true    Ran Out of Food in the Last Year: Never true  Transportation Needs: No Transportation Needs (06/23/2023)   PRAPARE - Administrator, Civil Service (Medical): No    Lack of Transportation (Non-Medical): No  Physical Activity: Not on file  Stress: Not on file  Social Connections: Unknown (01/06/2022)   Received from [facility], [facility]   Social Network    Social Network: Not  on file  Intimate Partner Violence: Not At Risk (06/23/2023)   Humiliation, Afraid, Rape, and Kick questionnaire    Fear of Current or Ex-Partner: No    Emotionally Abused: No    Physically Abused: No    Sexually Abused: No    Review of Systems: Pertinent positive and negative review of systems were noted in the above HPI section.  All other review of systems was otherwise negative.   Physical Exam: Vital signs in last 24 hours: Temp:  [97.8 F (36.6 C)-98.8 F (37.1 C)] 97.8 F (36.6 C) (10/31 0921) Pulse Rate:  [80-92] 86 (10/31 0921) Resp:  [18] 18 (10/31 0921) BP: (123-142)/(72-87) 123/74 (10/31 0921) SpO2:  [98 %-100 %] 100 % (10/31 4782)   General:   Alert,  Well-developed, well-nourished, older African-American female ,pleasant and cooperative in NAD.  Sitting up in chair, son at bedside Head:  Normocephalic and atraumatic. Eyes:  Sclera clear, no icterus.   Conjunctiva pink. Ears:  Normal auditory acuity. Nose:  No deformity, discharge,  or lesions. Mouth:  No deformity or lesions.   Neck:  Supple; no masses or thyromegaly. Lungs:  Clear throughout to auscultation.   No wheezes, crackles, or rhonchi.  Heart:  Regular rate and rhythm; no murmurs, clicks, rubs,  or gallops. Abdomen:  Soft, she is tender in the epigastrium, no rebound, BS active,nonpalp mass or hsm.   Rectal: Not done Msk:  Symmetrical without gross deformities. . Pulses:  Normal pulses noted. Extremities:  Without clubbing or edema. Neurologic:  Alert and  oriented x4;  grossly normal neurologically. Skin:  Intact without significant lesions or rashes.. Psych:  Alert and cooperative. Normal mood and affect.  Intake/Output from previous day: 10/30 0701 - 10/31 0700 In: 1676.1 [P.O.:720; I.V.:956.1] Out: 0  Intake/Output this shift: Total I/O In: 240 [P.O.:240] Out: 0   Lab Results: Recent Labs    06/23/23 1344 06/25/23 0435  WBC 9.4 9.0  HGB 10.1* 8.8*  HCT 32.5* 27.6*  PLT 262 230    BMET Recent Labs    06/23/23 1344 06/24/23 0704 06/25/23 0435  NA 142 139 145  K 3.6 3.3* 3.9  CL 109 114* 117*  CO2 19* 21* 20*  GLUCOSE 80 95 102*  BUN 21 16 13   CREATININE 2.38* 2.27* 2.35*  CALCIUM 9.2 8.4* 8.8*   LFT Recent Labs    06/23/23 1344  PROT 8.1  ALBUMIN 3.1*  AST 19  ALT 15  ALKPHOS 74  BILITOT 0.1*  BILIDIR <0.1  IBILI NOT CALCULATED   PT/INR No results for input(s): "LABPROT", "INR" in the last 72 hours. Hepatitis Panel No results for input(s): "HEPBSAG", "HCVAB", "HEPAIGM", "HEPBIGM" in the last 72 hours.    IMPRESSION:  42 62 year old African-American female with 1 month history of acute/subacute illness initially onset with general sense of unwellness, then sore throat nausea and vomiting. Initial diagnosis of ear infection and treated with Augmentin After seeing PCP found to have evidence of acute kidney injury etiology not clear-this persisted and with repeat labs was recommended to be admitted  She now relates ongoing epigastric pain which has been constant over the past month, and has been associated with a 20 pound weight loss.  No discrete dysphagia or odynophagia but definite early satiety type symptoms and is spitting her saliva frequently because she gets the sense that if it builds up it will increase likelihood of vomiting  Noncontrasted CT scan on admission raised possibility of haziness in the peripancreatic fat and perhaps mild pancreatic edema. However lipase normal on admission as her LFTs  Etiology of her above symptoms is not entirely clear, definitely contrasted imaging with MRI/MRCP will be helpful however this cannot be done until her creatinine clearance is greater than 30  Concerned about underlying malignancy, and also need to consider peptic ulcer disease/gastric lesion/penetrating ulcer with associated mild pancreatic inflammation  #2 acute kidney injury related to above-has had poor oral intake, however thus far renal  function has not recovered with fluids  #3 adult onset diabetes mellitus, on oral agent #4 hyperlipidemia-on Crestor  #5 anemia, normocytic-B12 and folate normal, iron studies not consistent with iron deficiency   PLAN: Okay to try full liquids this evening, will keep n.p.o. after midnight Hold off on starting PPI for now Patient has been scheduled for EGD with Dr. Rhea Belton tomorrow AM.  Procedure was discussed in detail with the patient including indications risks and benefits and she is agreeable to proceed.  Hopefully this may help clarify underlying pathology  Will hope that kidney function will recover enough over the next few days to pursue MRI  IgG4 was added and triglyceride level Will check sed rate and CRP  Further Recommendations pending findings at EGD     Gio Janoski EsterwoodPA-C  06/25/2023, 4:03 PM

## 2023-06-26 ENCOUNTER — Inpatient Hospital Stay (HOSPITAL_COMMUNITY): Payer: Medicare Other | Admitting: Certified Registered Nurse Anesthetist

## 2023-06-26 ENCOUNTER — Encounter (HOSPITAL_COMMUNITY)
Admission: EM | Disposition: A | Discharge status: Home / Self Care | Payer: Self-pay | Source: Home / Self Care | Attending: Family Medicine

## 2023-06-26 ENCOUNTER — Encounter (HOSPITAL_COMMUNITY): Payer: Self-pay | Admitting: Internal Medicine

## 2023-06-26 DIAGNOSIS — R634 Abnormal weight loss: Secondary | ICD-10-CM

## 2023-06-26 DIAGNOSIS — R112 Nausea with vomiting, unspecified: Secondary | ICD-10-CM

## 2023-06-26 DIAGNOSIS — R933 Abnormal findings on diagnostic imaging of other parts of digestive tract: Secondary | ICD-10-CM

## 2023-06-26 DIAGNOSIS — K2950 Unspecified chronic gastritis without bleeding: Secondary | ICD-10-CM | POA: Diagnosis not present

## 2023-06-26 DIAGNOSIS — R1013 Epigastric pain: Secondary | ICD-10-CM

## 2023-06-26 DIAGNOSIS — K2289 Other specified disease of esophagus: Secondary | ICD-10-CM | POA: Diagnosis not present

## 2023-06-26 DIAGNOSIS — N179 Acute kidney failure, unspecified: Secondary | ICD-10-CM | POA: Diagnosis not present

## 2023-06-26 HISTORY — PX: ESOPHAGOGASTRODUODENOSCOPY (EGD) WITH PROPOFOL: SHX5813

## 2023-06-26 HISTORY — PX: BIOPSY: SHX5522

## 2023-06-26 LAB — BASIC METABOLIC PANEL
Anion gap: 8 (ref 5–15)
BUN: 11 mg/dL (ref 8–23)
CO2: 18 mmol/L — ABNORMAL LOW (ref 22–32)
Calcium: 8.8 mg/dL — ABNORMAL LOW (ref 8.9–10.3)
Chloride: 117 mmol/L — ABNORMAL HIGH (ref 98–111)
Creatinine, Ser: 2.49 mg/dL — ABNORMAL HIGH (ref 0.44–1.00)
GFR, Estimated: 21 mL/min — ABNORMAL LOW (ref >60)
Glucose, Bld: 100 mg/dL — ABNORMAL HIGH (ref 70–99)
Potassium: 3.8 mmol/L (ref 3.5–5.1)
Sodium: 143 mmol/L (ref 135–145)

## 2023-06-26 LAB — GLUCOSE, CAPILLARY
Glucose-Capillary: 90 mg/dL (ref 70–99)
Glucose-Capillary: 129 mg/dL — ABNORMAL HIGH (ref 70–99)
Glucose-Capillary: 142 mg/dL — ABNORMAL HIGH (ref 70–99)
Glucose-Capillary: 94 mg/dL (ref 70–99)

## 2023-06-26 SURGERY — ESOPHAGOGASTRODUODENOSCOPY (EGD) WITH PROPOFOL
Anesthesia: Monitor Anesthesia Care

## 2023-06-26 MED ORDER — PROPOFOL 500 MG/50ML IV EMUL
INTRAVENOUS | Status: DC | PRN
  Administered 2023-06-26: 100 ug/kg/min via INTRAVENOUS

## 2023-06-26 MED ORDER — PROPOFOL 10 MG/ML IV BOLUS
INTRAVENOUS | Status: DC | PRN
  Administered 2023-06-26 (×4): 20 mg via INTRAVENOUS

## 2023-06-26 MED ORDER — PANTOPRAZOLE SODIUM 40 MG PO TBEC
40.0000 mg | DELAYED_RELEASE_TABLET | Freq: Every day | ORAL | Status: DC
  Administered 2023-06-26 – 2023-06-30 (×5): 40 mg via ORAL
  Filled 2023-06-26 (×5): qty 1

## 2023-06-26 MED ORDER — SODIUM CHLORIDE 0.9 % IV SOLN: INTRAVENOUS | Status: DC | PRN

## 2023-06-26 SURGICAL SUPPLY — 15 items

## 2023-06-26 NOTE — Anesthesia Procedure Notes (Signed)
Procedure Name: MAC Date/Time: 06/26/2023 9:03 AM  Performed by: Garfield Cornea, CRNAPre-anesthesia Checklist: Patient identified, Emergency Drugs available, Suction available and Patient being monitored Patient Re-evaluated:Patient Re-evaluated prior to induction Oxygen Delivery Method: Nasal cannula Dental Injury: Teeth and Oropharynx as per pre-operative assessment

## 2023-06-26 NOTE — Progress Notes (Signed)
  Progress Note   Patient: Brittany Colon UEA:[id number] DOB: [birth year] DOA: 06/23/2023     3 DOS: the patient was seen and examined on 06/26/2023       Brief hospital course: Mrs. Jenniges is a 62 y.o. F with hx BrCA s/p lumpectomy, mild DM well controlled, and HLD who presented with 1 month epigastric pain and nausea, and now AKI.     Assessment and Plan: * AKI (acute kidney injury) (HCC) Creatinine no change.  Urine output okay. - Nephrology planning for biopsy     Suspsected subacute pancreatitis Abnormal taste in mouth EGD today unremarkable. - Advance diet as tolerated - MRI abdomen with contrast when renal function allows - Will defer to GI and Nephrology, if work up would proceed faster in the hospital, and she cannot advance diet, will keep here.  If she can reasonably tolerate oral intake, and work up would proceed at same pace outpatient, will discharge   Type 2 diabetes mellitus (HCC) Glucose okay - Hold metformin - Continue SS corrections  Hyperlipidemia - Continue Crestor          Subjective: Patient having vague abdominal discomfort, nausea with oral intake today, but overall oral intake of fluids is good, no new vomiting, fever, melena, hematochezia.  EGD unremarkable.     Physical Exam: BP 130/86 (BP Location: Right Arm)   Pulse 89   Temp 98 F (36.7 C) (Oral)   Resp 20   Ht 5\' 2"  (1.575 m)   Wt 77.1 kg   SpO2 100%   BMI 31.09 kg/m   Adult female, sitting up in recliner, interactive and appropriate RRR, no murmurs, no peripheral edema Respiratory rate normal, lungs clear without rales or wheezes   Data Reviewed: Patient metabolic panel shows unchanged renal function EGD unremarkable  Family Communication: Called to daughter, no answer    Disposition: Status is: Inpatient         Author: Alberteen Sam, MD 06/26/2023 6:44 PM  For on call review [email or web address].

## 2023-06-26 NOTE — Progress Notes (Signed)
Patient refused VTE heparin, Patient is ambulatory.  Garwin Brothers RN

## 2023-06-26 NOTE — Anesthesia Postprocedure Evaluation (Signed)
Anesthesia Post Note  Patient: Brittany Colon  Procedure(s) Performed: ESOPHAGOGASTRODUODENOSCOPY (EGD) WITH PROPOFOL BIOPSY     Patient location during evaluation: Endoscopy Anesthesia Type: MAC Level of consciousness: oriented, awake and alert and awake Pain management: pain level controlled Vital Signs Assessment: post-procedure vital signs reviewed and stable Respiratory status: spontaneous breathing, nonlabored ventilation, respiratory function stable and patient connected to nasal cannula oxygen Cardiovascular status: blood pressure returned to baseline and stable Postop Assessment: no headache, no backache and no apparent nausea or vomiting Anesthetic complications: no   No notable events documented.  Last Vitals:  Vitals:   06/26/23 0950 06/26/23 1011  BP: 130/71 131/86  Pulse: 77 88  Resp: 16 20  Temp:    SpO2: 98% 100%    Last Pain:  Vitals:   06/26/23 0950  TempSrc:   PainSc: Asleep                 Collene Schlichter

## 2023-06-26 NOTE — Anesthesia Preprocedure Evaluation (Signed)
Anesthesia Evaluation  Patient identified by MRN, date of birth, ID band Patient awake    Reviewed: Allergy & Precautions, NPO status , Patient's Chart, lab work & pertinent test results  Airway Mallampati: II  TM Distance: >3 FB Neck ROM: Full    Dental  (+) Teeth Intact, Dental Advisory Given   Pulmonary neg pulmonary ROS   Pulmonary exam normal breath sounds clear to auscultation       Cardiovascular negative cardio ROS Normal cardiovascular exam Rhythm:Regular Rate:Normal     Neuro/Psych  Headaches    GI/Hepatic Neg liver ROS,GERD  Medicated,,  Endo/Other  diabetes, Type 2, Oral Hypoglycemic Agents  Obesity   Renal/GU Renal disease (AKI)     Musculoskeletal negative musculoskeletal ROS (+)    Abdominal   Peds  Hematology  (+) Blood dyscrasia, anemia   Anesthesia Other Findings Day of surgery medications reviewed with the patient.  Right breast cancer s/p radiation  Reproductive/Obstetrics                             Anesthesia Physical Anesthesia Plan  ASA: 2  Anesthesia Plan: MAC   Post-op Pain Management: Minimal or no pain anticipated   Induction: Intravenous  PONV Risk Score and Plan: 2 and TIVA and Treatment may vary due to age or medical condition  Airway Management Planned: Natural Airway and Simple Face Mask  Additional Equipment:   Intra-op Plan:   Post-operative Plan:   Informed Consent: I have reviewed the patients History and Physical, chart, labs and discussed the procedure including the risks, benefits and alternatives for the proposed anesthesia with the patient or authorized representative who has indicated his/her understanding and acceptance.     Dental advisory given  Plan Discussed with: CRNA and Anesthesiologist  Anesthesia Plan Comments:        Anesthesia Quick Evaluation

## 2023-06-26 NOTE — Progress Notes (Signed)
Subjective:  No acute complaints/concerns this morning. Underwent upper endoscopy, tolerated well. Still does not have much appetite but is going to attempt to eat her lunch now.  Objective Vital signs in last 24 hours: Vitals:   06/25/23 0921 06/25/23 1652 06/25/23 1935 06/26/23 0459  BP: 123/74 119/66 138/79 126/75  Pulse: 86 80 87 86  Resp: 18 18 16 18   Temp: 97.8 F (36.6 C) 99.1 F (37.3 C) 99.1 F (37.3 C) 99.3 F (37.4 C)  TempSrc: Oral Oral Oral Oral  SpO2: 100% 98% 99%   Weight:      Height:       Weight change:   Intake/Output Summary (Last 24 hours) at 06/26/2023 0717 Last data filed at 06/26/2023 0706 Gross per 24 hour  Intake 1080 ml  Output 625 ml  Net 455 ml   Labs Reviewed: CO2 18 BUN 11/Cr 2.49 (13/2.52) Ca 8.8 GFR 21  I's & O's: Net +4.3L, 625 mL + 1x unmeasured UOP 10/31  Constitutional:Well-appearing, seated in bedside recliner. In no acute distress. Cardio:Regular rate and rhythm. No murmurs, rubs, or gallops. Negative for pitting edema to extremities. Pulm:Clear to auscultation bilaterally. Normal work of breathing on room air. Abdomen:Soft, non-distended, tender to palpation over epigastrium. Skin:Warm and dry. Neuro:Alert and oriented x3. No focal deficit noted. Psych:Pleasant mood and affect.  Assessment/ Plan: Pt is a 62 y.o. yo female who was admitted on 06/23/2023 with T2DM, HTN, breast cancer s/p lumpectomy and radiation, HLD who is admitted with AKI following 3-4 weeks of poor PO intake with nausea and vomiting, that has not improved with hydration.  AKI: Renal function stable this morning, no major improvement from yesterday but is not getting worse which is reassuring. GI was consulted by primary 10/31 and discussed rare but possible pending their work-up inflammation related to IgG4. She is making urine. Encourage PO intake Recommend starting H2B rather than resuming PPI  Avoid nephrotoxic agents No indication for biopsy at this time  but would consider if no improvement of renal function with time and supportive care as above F/u IgG4 studies Metabolic acidosis: Mild with bicarbonate of 18, no anion gap. Trend renal function. Normocytic anemia: No signs of active bleed. Iron studies are indeterminate. Per primary. Volume/HTN: No history of CHF, not on anti-hypertensives as OP. BP stable at this time, clinically euvolemic. BP WNL.   Labs: Basic Metabolic Panel: Recent Labs  Lab 06/25/23 0435 06/25/23 1907 06/26/23 0413  NA 145 141 143  K 3.9 3.7 3.8  CL 117* 113* 117*  CO2 20* 19* 18*  GLUCOSE 102* 119* 100*  BUN 13 13 11   CREATININE 2.35* 2.52* 2.49*  CALCIUM 8.8* 8.9 8.8*   Liver Function Tests: Recent Labs  Lab 06/23/23 1344  AST 19  ALT 15  ALKPHOS 74  BILITOT 0.1*  PROT 8.1  ALBUMIN 3.1*   Recent Labs  Lab 06/23/23 1344  LIPASE 38   No results for input(s): "AMMONIA" in the last 168 hours. CBC: Recent Labs  Lab 06/23/23 1344 06/25/23 0435  WBC 9.4 9.0  HGB 10.1* 8.8*  HCT 32.5* 27.6*  MCV 87.4 86.8  PLT 262 230   Cardiac Enzymes: No results for input(s): "CKTOTAL", "CKMB", "CKMBINDEX", "TROPONINI" in the last 168 hours. CBG: Recent Labs  Lab 06/24/23 2132 06/25/23 0725 06/25/23 1121 06/25/23 1651 06/25/23 1949  GLUCAP 70 91 87 81 92    Iron Studies:  Recent Labs    06/24/23 0704  IRON 25*  TIBC 217*  FERRITIN  204   Studies/Results: No results found.  Medications:  Scheduled Medications:  heparin  5,000 Units Subcutaneous Q8H   insulin aspart  0-5 Units Subcutaneous QHS   insulin aspart  0-9 Units Subcutaneous TID WC   rosuvastatin  20 mg Oral Daily   I have reviewed scheduled and prn medications.    Champ Mungo, DO Internal Medicine PGY-3 06/26/2023,7:17 AM  LOS: 3 days

## 2023-06-26 NOTE — Transfer of Care (Signed)
Immediate Anesthesia Transfer of Care Note  Patient: Brittany Colon  Procedure(s) Performed: ESOPHAGOGASTRODUODENOSCOPY (EGD) WITH PROPOFOL BIOPSY  Patient Location: Endoscopy Unit  Anesthesia Type:MAC  Level of Consciousness: drowsy  Airway & Oxygen Therapy: Patient Spontanous Breathing and Patient connected to nasal cannula oxygen  Post-op Assessment: Report given to RN and Post -op Vital signs reviewed and stable  Post vital signs: Reviewed and stable  Last Vitals:  Vitals Value Taken Time  BP    Temp    Pulse 80 06/26/23 0929  Resp 20 06/26/23 0929  SpO2 99 % 06/26/23 0929  Vitals shown include unfiled device data.  Last Pain:  Vitals:   06/26/23 0821  TempSrc: Tympanic  PainSc: 6          Complications: No notable events documented.

## 2023-06-26 NOTE — Interval H&P Note (Signed)
History and Physical Interval Note: For EGD today to eval epigastric pain, nausea. The nature of the procedure, as well as the risks, benefits, and alternatives were carefully and thoroughly reviewed with the patient. Ample time for discussion and questions allowed. The patient understood, was satisfied, and agreed to proceed.   06/26/2023 8:59 AM  Brittany Colon  has presented today for surgery, with the diagnosis of Gastric pain, weight loss, early satiety, regurgitation.  The various methods of treatment have been discussed with the patient and family. After consideration of risks, benefits and other options for treatment, the patient has consented to  Procedure(s): ESOPHAGOGASTRODUODENOSCOPY (EGD) WITH PROPOFOL (N/A) as a surgical intervention.  The patient's history has been reviewed, patient examined, no change in status, stable for surgery.  I have reviewed the patient's chart and labs.  Questions were answered to the patient's satisfaction.     Carie Caddy Nyquan Selbe

## 2023-06-26 NOTE — Plan of Care (Signed)
  Problem: Education: Goal: Ability to describe self-care measures that may prevent or decrease complications (Diabetes Survival Skills Education) will improve Outcome: Progressing Goal: Individualized Educational Video(s) Outcome: Progressing   Problem: Coping: Goal: Ability to adjust to condition or change in health will improve Outcome: Progressing   Problem: Fluid Volume: Goal: Ability to maintain a balanced intake and output will improve Outcome: Progressing   Problem: Health Behavior/Discharge Planning: Goal: Ability to identify and utilize available resources and services will improve Outcome: Progressing Goal: Ability to manage health-related needs will improve Outcome: Progressing   Problem: Metabolic: Goal: Ability to maintain appropriate glucose levels will improve Outcome: Progressing   Problem: Nutritional: Goal: Maintenance of adequate nutrition will improve Outcome: Progressing Goal: Progress toward achieving an optimal weight will improve Outcome: Progressing   Problem: Skin Integrity: Goal: Risk for impaired skin integrity will decrease Outcome: Progressing   Problem: Tissue Perfusion: Goal: Adequacy of tissue perfusion will improve Outcome: Progressing   Problem: Education: Goal: Knowledge of General Education information will improve Description: Including pain rating scale, medication(s)/side effects and non-pharmacologic comfort measures Outcome: Progressing   Problem: Health Behavior/Discharge Planning: Goal: Ability to manage health-related needs will improve Outcome: Progressing   Problem: Clinical Measurements: Goal: Ability to maintain clinical measurements within normal limits will improve Outcome: Progressing Goal: Will remain free from infection Outcome: Progressing Goal: Diagnostic test results will improve Outcome: Progressing Goal: Respiratory complications will improve Outcome: Progressing Goal: Cardiovascular complication will  be avoided Outcome: Progressing   Problem: Activity: Goal: Risk for activity intolerance will decrease Outcome: Progressing   Problem: Nutrition: Goal: Adequate nutrition will be maintained Outcome: Progressing   Problem: Coping: Goal: Level of anxiety will decrease Outcome: Progressing   Problem: Elimination: Goal: Will not experience complications related to bowel motility Outcome: Progressing Goal: Will not experience complications related to urinary retention Outcome: Progressing   Problem: Pain Management: Goal: General experience of comfort will improve Outcome: Progressing   Problem: Safety: Goal: Ability to remain free from injury will improve Outcome: Progressing   Problem: Skin Integrity: Goal: Risk for impaired skin integrity will decrease Outcome: Progressing   Problem: Education: Goal: Knowledge of disease and its progression will improve Outcome: Progressing   Problem: Health Behavior/Discharge Planning: Goal: Ability to manage health-related needs will improve Outcome: Progressing   Problem: Clinical Measurements: Goal: Complications related to the disease process or treatment will be avoided or minimized Outcome: Progressing Goal: Dialysis access will remain free of complications Outcome: Progressing   Problem: Activity: Goal: Activity intolerance will improve Outcome: Progressing   Problem: Fluid Volume: Goal: Fluid volume balance will be maintained or improved Outcome: Progressing   Problem: Nutritional: Goal: Ability to make appropriate dietary choices will improve Outcome: Progressing   Problem: Respiratory: Goal: Respiratory symptoms related to disease process will be avoided Outcome: Progressing   Problem: Self-Concept: Goal: Body image disturbance will be avoided or minimized Outcome: Progressing   Problem: Urinary Elimination: Goal: Progression of disease will be identified and treated Outcome: Progressing

## 2023-06-26 NOTE — Op Note (Signed)
[facility] Patient Name: Brittany Colon Procedure Date : 06/26/2023 MRN: [id number] Attending MD: Beverley Fiedler , MD, [id number] Date of Birth: Jun 11, 1961 CSN: [id number] Age: 61 Admit Type: Inpatient Procedure:                Upper GI endoscopy Indications:              Epigastric abdominal pain, Nausea with vomiting,                            Weight loss Providers:                Carie Caddy. Rhea Belton, MD, Stephens Shire RN, RN, Rozetta Nunnery, Technician Referring MD:             [facility] Medicines:                Monitored Anesthesia Care Complications:            No immediate complications. Estimated Blood Loss:     Estimated blood loss was minimal. Procedure:                Pre-Anesthesia Assessment:                           - Prior to the procedure, a History and Physical                            was performed, and patient medications and                            allergies were reviewed. The patient's tolerance of                            previous anesthesia was also reviewed. The risks                            and benefits of the procedure and the sedation                            options and risks were discussed with the patient.                            All questions were answered, and informed consent                            was obtained. Prior Anticoagulants: The patient has                            taken no anticoagulant or antiplatelet agents. ASA                            Grade Assessment: II - A patient with mild systemic  disease. After reviewing the risks and benefits,                            the patient was deemed in satisfactory condition to                            undergo the procedure.                           After obtaining informed consent, the endoscope was                            passed under direct vision. Throughout the                             procedure, the patient's blood pressure, pulse, and                            oxygen saturations were monitored continuously. The                            [id number]-[id number] ([id number]) Olympus endoscope was introduced                            through the mouth, and advanced to the second part                            of duodenum. The upper GI endoscopy was                            accomplished without difficulty. The patient                            tolerated the procedure well. Scope In: Scope Out: Findings:      A single 15 mm submucosal nodule was found in the lower third of the       esophagus, 37 cm from the incisors. Query esophagal duplication cyst.      The exam of the esophagus was otherwise normal.      The entire examined stomach was normal. Biopsies were taken with a cold       forceps for histology and Helicobacter pylori testing.      The examined duodenum was normal. Impression:               - Submucosal nodule found in the lower esophagus                            (just proximal to GE junction). This is not felt ot                            explain current symptoms. Query esophageal                            duplication cyst.                           -  Normal stomach. Biopsied.                           - Normal examined duodenum. Moderate Sedation:      N/A Recommendation:           - Return patient to hospital ward for ongoing care.                           - Advance diet as tolerated.                           - Continue present medications. Daily PPI                            recommended given nausea, recent vomiting.                           - Await pathology results.                           - Patient needs contrasted MRI abd/pelvis to                            further evaluate symptoms and question of                            pancreatitis inflammation on non-contrasted CT scan                            (when renal function allows, per radiology GFR  > 30                            for MRI contrast).                           - Await additional pending laboratory tests (IgG4). Procedure Code(s):        --- Professional ---                           [phone], Esophagogastroduodenoscopy, flexible,                            transoral; with biopsy, single or multiple Diagnosis Code(s):        --- Professional ---                           K22.89, Other specified disease of esophagus                           R10.13, Epigastric pain                           R11.2, Nausea with vomiting, unspecified                           R63.4, Abnormal weight loss CPT copyright 2022 American [employer] Association. All  rights reserved. The codes documented in this report are preliminary and upon coder review may  be revised to meet current compliance requirements. Beverley Fiedler, MD 06/26/2023 9:42:21 AM This report has been signed electronically. Number of Addenda: 0

## 2023-06-27 ENCOUNTER — Inpatient Hospital Stay (HOSPITAL_COMMUNITY): Payer: Medicare Other

## 2023-06-27 ENCOUNTER — Encounter (HOSPITAL_COMMUNITY): Payer: Self-pay | Admitting: Internal Medicine

## 2023-06-27 DIAGNOSIS — E119 Type 2 diabetes mellitus without complications: Secondary | ICD-10-CM | POA: Diagnosis not present

## 2023-06-27 DIAGNOSIS — K859 Acute pancreatitis without necrosis or infection, unspecified: Secondary | ICD-10-CM | POA: Diagnosis not present

## 2023-06-27 DIAGNOSIS — R432 Parageusia: Secondary | ICD-10-CM | POA: Diagnosis not present

## 2023-06-27 DIAGNOSIS — N179 Acute kidney failure, unspecified: Secondary | ICD-10-CM | POA: Diagnosis not present

## 2023-06-27 LAB — BASIC METABOLIC PANEL
Anion gap: 9 (ref 5–15)
BUN: 11 mg/dL (ref 8–23)
CO2: 18 mmol/L — ABNORMAL LOW (ref 22–32)
Calcium: 8.7 mg/dL — ABNORMAL LOW (ref 8.9–10.3)
Chloride: 112 mmol/L — ABNORMAL HIGH (ref 98–111)
Creatinine, Ser: 2.61 mg/dL — ABNORMAL HIGH (ref 0.44–1.00)
GFR, Estimated: 20 mL/min — ABNORMAL LOW (ref >60)
Glucose, Bld: 106 mg/dL — ABNORMAL HIGH (ref 70–99)
Potassium: 3.6 mmol/L (ref 3.5–5.1)
Sodium: 139 mmol/L (ref 135–145)

## 2023-06-27 LAB — GLUCOSE, CAPILLARY
Glucose-Capillary: 119 mg/dL — ABNORMAL HIGH (ref 70–99)
Glucose-Capillary: 104 mg/dL — ABNORMAL HIGH (ref 70–99)
Glucose-Capillary: 91 mg/dL (ref 70–99)
Glucose-Capillary: 92 mg/dL (ref 70–99)

## 2023-06-27 MED ORDER — GADOBUTROL 1 MMOL/ML IV SOLN
7.0000 mL | Freq: Once | INTRAVENOUS | Status: AC | PRN
Start: 2023-06-27 — End: 2023-06-27
  Administered 2023-06-27: 7 mL via INTRAVENOUS

## 2023-06-27 NOTE — Progress Notes (Signed)
Admit: 06/23/2023 LOS: 4  73F with progressive subacute kidney disease of unclear etiology with bland urine, poor oral intake with nausea.  Subjective:  No interval events, still really only taking fluids and MRCP recommended by GI Creatinine continues to slightly worsen, now is 2.6 Quantified UOP not reported Blood pressures are stable request for kidney biopsy for AKI  11/01 0701 - 11/02 0700 In: 580 [P.O.:580] Out: 0   Filed Weights   06/23/23 1631  Weight: 77.1 kg    Scheduled Meds:  heparin  5,000 Units Subcutaneous Q8H   insulin aspart  0-5 Units Subcutaneous QHS   insulin aspart  0-9 Units Subcutaneous TID WC   pantoprazole  40 mg Oral QAC breakfast   rosuvastatin  20 mg Oral Daily   Continuous Infusions: PRN Meds:.acetaminophen **OR** acetaminophen, diazepam  Current Labs: reviewed   Latest Reference Range & Units 06/24/23 11:38  Protein Creatinine Ratio 0.00 - 0.15 mg/mgCre 0.69 (H)  (H): Data is abnormally high  Physical Exam:  Blood pressure 120/61, pulse 94, temperature 98.3 F (36.8 C), resp. rate 16, height 5\' 2"  (1.575 m), weight 77.1 kg, SpO2 100%. Well-appearing, NAD Normal mood and affect Clear bilaterally Regular, normal S1 and S2 No peripheral edema No rashes, no petechia or purpura Soft, nontender  A Subacute kidney injury of unclear etiology with no hematuria or pyuria, UPC of less than 1.  No clear nephrotoxin exposures.  CT abdomen/pelvis with negative imaging for obstruction, mass, stones. Poor oral intake with nausea, EGD largely reassuring.  MRCP recommended because of question of anemia and subtle haziness of the peripancreatic fat.  Query IgG4 disease, value pending. NAG metabolic acidosis History of breast cancer  P Kidney function continues to slowly decline, I think definitive kidney biopsy is reasonable here as an inpatient, hopefully IR can accommodate this on 11/4. GFR is permissive for MRI contrast, MRCP okay this  weekend. Medication Issues; Preferred narcotic agents for pain control are hydromorphone, fentanyl, and methadone. Morphine should not be used.  Baclofen should be avoided Avoid oral sodium phosphate and magnesium citrate based laxatives / bowel preps    Sabra Heck MD 06/27/2023, 9:47 AM  Recent Labs  Lab 06/25/23 1907 06/26/23 0413 06/27/23 0413  NA 141 143 139  K 3.7 3.8 3.6  CL 113* 117* 112*  CO2 19* 18* 18*  GLUCOSE 119* 100* 106*  BUN 13 11 11   CREATININE 2.52* 2.49* 2.61*  CALCIUM 8.9 8.8* 8.7*   Recent Labs  Lab 06/23/23 1344 06/25/23 0435  WBC 9.4 9.0  HGB 10.1* 8.8*  HCT 32.5* 27.6*  MCV 87.4 86.8  PLT 262 230

## 2023-06-27 NOTE — Progress Notes (Signed)
Subjective: No complaints.  Feeling well.  Objective: Vital signs in last 24 hours: Temp:  [97 F (36.1 C)-98.4 F (36.9 C)] 98.3 F (36.8 C) (11/02 0832) Pulse Rate:  [77-94] 94 (11/02 0832) Resp:  [14-20] 16 (11/02 0832) BP: (114-140)/(61-86) 120/61 (11/02 0832) SpO2:  [98 %-100 %] 100 % (11/02 0832) FiO2 (%):  [21 %] 21 % (11/02 0832)    Intake/Output from previous day: 11/01 0701 - 11/02 0700 In: 580 [P.O.:580] Out: 0  Intake/Output this shift: No intake/output data recorded.  General appearance: alert and no distress GI: soft, non-tender; bowel sounds normal; no masses,  no organomegaly  Lab Results: Recent Labs    06/25/23 0435  WBC 9.0  HGB 8.8*  HCT 27.6*  PLT 230   BMET Recent Labs    06/25/23 1907 06/26/23 0413 06/27/23 0413  NA 141 143 139  K 3.7 3.8 3.6  CL 113* 117* 112*  CO2 19* 18* 18*  GLUCOSE 119* 100* 106*  BUN 13 11 11   CREATININE 2.52* 2.49* 2.61*  CALCIUM 8.9 8.8* 8.7*   LFT No results for input(s): "PROT", "ALBUMIN", "AST", "ALT", "ALKPHOS", "BILITOT", "BILIDIR", "IBILI" in the last 72 hours. PT/INR No results for input(s): "LABPROT", "INR" in the last 72 hours. Hepatitis Panel No results for input(s): "HEPBSAG", "HCVAB", "HEPAIGM", "HEPBIGM" in the last 72 hours. C-Diff No results for input(s): "CDIFFTOX" in the last 72 hours. Fecal Lactopherrin No results for input(s): "FECLLACTOFRN" in the last 72 hours.  Studies/Results: No results found.  Medications: Scheduled:  heparin  5,000 Units Subcutaneous Q8H   insulin aspart  0-5 Units Subcutaneous QHS   insulin aspart  0-9 Units Subcutaneous TID WC   pantoprazole  40 mg Oral QAC breakfast   rosuvastatin  20 mg Oral Daily   Continuous:  Assessment/Plan: 1) ? Pancreatic malig. 2) AKI.   Her GFR is still too low for the MRI.  The goal is to have a GFR of >30.  Plan: 1) Continue with supportive care. 2) Monitor creatinine/GFR.  LOS: 4 days   Brittany Colon D 06/27/2023,  9:27 AM

## 2023-06-27 NOTE — Progress Notes (Addendum)
  Progress Note   Patient: Brittany Colon JJK:[id number] DOB: [birth year] DOA: 06/23/2023     4 DOS: the patient was seen and examined on 06/27/2023 at 10:21AM      Brief hospital course: Brittany Colon is a 62 y.o. F with hx BrCA s/p lumpectomy, mild DM well controlled, and HLD who presented with 1 month epigastric pain and nausea, and now AKI.     Assessment and Plan: * AKI (acute kidney injury) (HCC) Creatinine unchanged.  Urine output not measured. -Consult nephrology - Plan for kidney biopsy on Monday     Suspsected subacute pancreatitis Unchanged, still intolerant of advancing diet.  Developing pain with anything more than clear liquids.  No vomiting.  Discussed MRI abdomen with gadolinium with nephrology, they recommend to proceed. - MRI abdomen with gadolinium   Type 2 diabetes mellitus (HCC) A1c well controlled - Hold metformin - Continue SS corrections  Hyperlipidemia - Continue Crestor          Subjective: Patient still had abdominal pain with eating.  She has had no fever, no vomiting, she has no other concerns.  Nursing have no complaints.     Physical Exam: BP 120/61 (BP Location: Right Arm)   Pulse 94   Temp 98.3 F (36.8 C)   Resp 16   Ht 5\' 2"  (1.575 m)   Wt 77.1 kg   SpO2 100%   BMI 31.09 kg/m   Adult female, sitting up in recliner, interactive and appropriate RRR, no murmurs, no peripheral edema Respiratory rate normal, lungs clear without rales or wheezes Abdomen with epigastric tenderness, quite sensitive, voluntary guarding, no rigidity, no rebound, moving around in the chair normally comfortable at rest Attention normal, affect appropriate, judgment and insight appear normal    Data Reviewed: Patient metabolic panel shows creatinine 2.6, no change Discussed with Nephrology   Family Communication: Daughter by phone    Disposition: Status is: Inpatient         Author: Alberteen Sam, MD 06/27/2023 11:19  AM  For on call review [email or web address].

## 2023-06-27 NOTE — Consult Note (Signed)
Chief Complaint: Patient was seen in consultation today for random renal bx  Chief Complaint  Patient presents with   Abnormal Lab   at the request of Sabra Heck   Referring Physician(s): Sabra Heck   Supervising Physician: Gilmer Mor  Patient Status: [facility] - In-pt  History of Present Illness: Brittany Colon is a 62 y.o. female with PMHs of right breast CA s/p lumpectomy in 2014, diet controlled DM, who was admitted due to epigastric pain and AKI, IR was consulted for random renal bx.   Patient was sent to ED on 10/29 from PCP office due to AKI. BMP showed BUN 21, creatinine 2.38 and GFR 23, CBC with mild anemia hgb 10.1. Patient also reported upper abdominal pain for the past 2 to 3 weeks associated with intermittent emesis, CT A/P w/o showed fibroid uterus and question edema within the pancreas and possible subtle haziness of the peripancreatic fat, correlation with lipase was recommended, lipase wnl.   Patient was admitted for further eval and management, GI was consulted and patient underwent EGD on 06/26/23 which showed a submucosal esophageal nodule, otherwise normal appearing eso/stomach. Patient is scheduled for MRCP. Nephrology was also consulted, random renal bx for further eval was recommended to the patient which she decided to proceed.   Patient sitting ina recliner,  not in acute distress. Daughter joins the conversation via phone.  Reports persistent upper abdominal pain this morning, no N/V today.  Denise headache, fever, chills, shortness of breath, cough, chest pain, nausea ,vomiting, and bleeding.  Patient was informed that the biopsy may not be performed on Monday due to IR schedule, if IR not able to accommodate on Monday the bx will be performed on Tuesday. She verbalized understanding.  All questions answered, informed consent obtained in left in chat.    Past Medical History:  Diagnosis Date   Allergy    Breast cancer (HCC) 12/14/12   right    Breast cyst 12/18/12   mult bilaterally per MRI   Headache(784.0)    History of radiation therapy 02/09/2013-03/31/2013   62.4 gray to the right breast   Personal history of radiation therapy 2014   Right Breast Cancer    Past Surgical History:  Procedure Laterality Date   BREAST BIOPSY Right 2014   BREAST LUMPECTOMY Right 2014   BREAST LUMPECTOMY WITH NEEDLE LOCALIZATION Right 01/10/2013   Procedure: BREAST LUMPECTOMY WITH NEEDLE LOCALIZATION;  Surgeon: Mariella Saa, MD;  Location: [facility];  Service: General;  Laterality: Right;   CARPAL TUNNEL RELEASE Right    FOOT SURGERY  1993   bone spur lt foot   KNEE SURGERY Bilateral     Allergies: Elemental sulfur and Sulfa antibiotics  Medications: Prior to Admission medications   Medication Sig Start Date End Date Taking? Authorizing Provider  acetaminophen (TYLENOL) 500 MG tablet Take 1,000 mg by mouth every 8 (eight) hours as needed for mild pain (pain score 1-3) or moderate pain (pain score 4-6).   Yes [provider]  amoxicillin-clavulanate (AUGMENTIN) 875-125 MG tablet Take 1 tablet by mouth 2 (two) times daily. 06/08/23  Yes [provider]  calcium carbonate (SUPER CALCIUM) 1500 (600 Ca) MG TABS tablet Take 600 mg by mouth daily.   Yes [provider]  ibuprofen (ADVIL) 800 MG tablet Take 800 mg by mouth every 8 (eight) hours as needed for mild pain (pain score 1-3) or moderate pain (pain score 4-6).   Yes [provider]  Melaton-Thean-Cham-PassF-LBalm (MELATONIN +  L-THEANINE PO) Take 1 capsule by mouth at bedtime as needed.   Yes [provider]  metFORMIN (GLUCOPHAGE-XR) 500 MG 24 hr tablet Take 500 mg by mouth See admin instructions. Take 1 tablet by mouth with evening meal every day.   Yes [provider]  Multiple Vitamins-Minerals (MULTIVITAMIN PO) Take by mouth daily.   Yes [provider]  ondansetron (ZOFRAN) 8 MG tablet Take 1 tablet  (8 mg total) by mouth every 8 (eight) hours as needed for nausea or vomiting. 07/07/18  Yes Rodriguez-Southworth, Nettie Elm, PA-C  pantoprazole (PROTONIX) 40 MG tablet Take 40 mg by mouth 2 (two) times daily. 06/15/23  Yes [provider]  rosuvastatin (CRESTOR) 20 MG tablet Take 1 tablet by mouth daily. 03/18/21  Yes [provider]  nystatin (MYCOSTATIN) 100000 UNIT/ML suspension Take 5 mLs by mouth 4 (four) times daily. 06/08/23   [provider]     Family History  Problem Relation Age of Onset   Heart disease Mother     Social History   Socioeconomic History   Marital status: Married    Spouse name: Not on file   Number of children: 2   Years of education: Not on file   Highest education level: Not on file  Occupational History    Employer: [employer]  Tobacco Use   Smoking status: Never   Smokeless tobacco: Not on file  Substance and Sexual Activity   Alcohol use: No   Drug use: No   Sexual activity: Yes    Comment: menarche 89, P2, 1st birth age 74, no BCP  Other Topics Concern   Not on file  Social History Narrative   Not on file   Social Determinants of Health   Financial Resource Strain: Not on file  Food Insecurity: No Food Insecurity (06/23/2023)   Hunger Vital Sign    Worried About Running Out of Food in the Last Year: Never true    Ran Out of Food in the Last Year: Never true  Transportation Needs: No Transportation Needs (06/23/2023)   PRAPARE - Administrator, Civil Service (Medical): No    Lack of Transportation (Non-Medical): No  Physical Activity: Not on file  Stress: Not on file  Social Connections: Unknown (01/06/2022)   Received from [facility]   Social Network    Social Network: Not on file     Review of Systems: A 12 point ROS discussed and pertinent positives are indicated in the HPI above.  All other systems are negative.  Vital Signs: BP 120/61 (BP Location: Right Arm)    Pulse 94   Temp 98.3 F (36.8 C)   Resp 16   Ht 5\' 2"  (1.575 m)   Wt 170 lb (77.1 kg)   SpO2 100%   BMI 31.09 kg/m    Physical Exam Vitals and nursing note reviewed.  Constitutional:      General: Patient is not in acute distress.    Appearance: Normal appearance. Patient is not ill-appearing.  HENT:     Head: Normocephalic and atraumatic.     Mouth/Throat:     Mouth: Mucous membranes are moist.     Pharynx: Oropharynx is clear.  Cardiovascular:     Rate and Rhythm: Normal rate and regular rhythm.     Pulses: Normal pulses.     Heart sounds: Normal heart sounds.  Pulmonary:     Effort: Pulmonary effort is normal.     Breath sounds: Normal  breath sounds.  Abdominal:     General: Abdomen is flat. Bowel sounds are normal.     Palpations: Abdomen is soft.  Musculoskeletal:     Cervical back: Neck supple.  Skin:    General: Skin is warm and dry.     Coloration: Skin is not jaundiced or pale.  Neurological:     Mental Status: Patient is alert and oriented to person, place, and time.  Psychiatric:        Mood and Affect: Mood normal.        Behavior: Behavior normal.        Judgment: Judgment normal.    MD Evaluation Airway: WNL Heart: WNL Abdomen: WNL Chest/ Lungs: WNL ASA  Classification: 2 Mallampati/Airway Score: One  Imaging: CT ABDOMEN PELVIS WO CONTRAST  Result Date: 06/23/2023 CLINICAL DATA:  Epigastric pain EXAM: CT ABDOMEN AND PELVIS WITHOUT CONTRAST TECHNIQUE: Multidetector CT imaging of the abdomen and pelvis was performed following the standard protocol without IV contrast. RADIATION DOSE REDUCTION: This exam was performed according to the departmental dose-optimization program which includes automated exposure control, adjustment of the mA and/or kV according to patient size and/or use of iterative reconstruction technique. COMPARISON:  None Available. FINDINGS: Lower chest: No acute abnormality. Hepatobiliary: Unremarkable. Pancreas: Question edema  within the pancreas and possible subtle haziness of the peripancreatic fat. Spleen: Unremarkable. Adrenals/Urinary Tract: Unremarkable adrenal glands and bladder. No urinary calculi or hydronephrosis. Stomach/Bowel: No obstruction or bowel wall thickening. Vascular/Lymphatic: No significant vascular findings are present. No enlarged abdominal or pelvic lymph nodes. Reproductive: Fibroid uterus.  No adnexal mass. Other: No free intraperitoneal fluid or air. Musculoskeletal: No acute fracture. IMPRESSION: 1. Question edema within the pancreas and possible subtle haziness of the peripancreatic fat. This may be within normal limits though correlation with lipase is recommended. 2. Fibroid uterus. Electronically Signed   By: Minerva Fester M.D.   On: 06/23/2023 19:19    Labs:  CBC: Recent Labs    06/23/23 1344 06/25/23 0435  WBC 9.4 9.0  HGB 10.1* 8.8*  HCT 32.5* 27.6*  PLT 262 230    COAGS: No results for input(s): "INR", "APTT" in the last 8760 hours.  BMP: Recent Labs    06/25/23 0435 06/25/23 1907 06/26/23 0413 06/27/23 0413  NA 145 141 143 139  K 3.9 3.7 3.8 3.6  CL 117* 113* 117* 112*  CO2 20* 19* 18* 18*  GLUCOSE 102* 119* 100* 106*  BUN 13 13 11 11   CALCIUM 8.8* 8.9 8.8* 8.7*  CREATININE 2.35* 2.52* 2.49* 2.61*  GFRNONAA 23* 21* 21* 20*    LIVER FUNCTION TESTS: Recent Labs    06/23/23 1344  BILITOT 0.1*  AST 19  ALT 15  ALKPHOS 74  PROT 8.1  ALBUMIN 3.1*    TUMOR MARKERS: No results for input(s): "AFPTM", "CEA", "CA199", "CHROMGRNA" in the last 8760 hours.  Assessment and Plan: 62 y.o. female with AKI with unknown etiology who presents for random renal bx.   VSS CBC on 10/31 with hgb 8,8, plt 230 - will repeat on Monday  No recent INR On sq heparin q8r  Risks and benefits of random renal bx was discussed with the patient and/or patient's family including, but not limited to bleeding, infection, damage to adjacent structures or low yield requiring  additional tests.  All of the questions were answered and there is agreement to proceed.  Consent signed and in chart.   The procedure is tentatively scheduled for Monday 11/4 pending IR schedule.  PLAN - NPO  except meds Monday MN - INR tomorrow - CBC with diff on Monday - Monday 6 am and 2 pm sq heparin MAR held  - IR will call for the patient when ready   Thank you for this interesting consult.  I greatly enjoyed meeting Brittany Colon and look forward to participating in their care.  A copy of this report was sent to the requesting provider on this date.  Electronically Signed: Willette Brace, PA-C 06/27/2023, 12:17 PM   I spent a total of 40 Minutes    in face to face in clinical consultation, greater than 50% of which was counseling/coordinating care for random renal bx.   This chart was dictated using voice recognition software.  Despite best efforts to proofread,  errors can occur which can change the documentation meaning.

## 2023-06-28 DIAGNOSIS — N179 Acute kidney failure, unspecified: Secondary | ICD-10-CM | POA: Diagnosis not present

## 2023-06-28 LAB — URINALYSIS, ROUTINE W REFLEX MICROSCOPIC
Bacteria, UA: NONE SEEN
Bilirubin Urine: NEGATIVE
Glucose, UA: NEGATIVE mg/dL
Ketones, ur: NEGATIVE mg/dL
Nitrite: NEGATIVE
Protein, ur: 30 mg/dL — AB
Specific Gravity, Urine: 1.006 (ref 1.005–1.030)
pH: 5 (ref 5.0–8.0)

## 2023-06-28 LAB — CBC WITH DIFFERENTIAL/PLATELET
Abs Immature Granulocytes: 0.04 10*3/uL (ref 0.00–0.07)
Basophils Absolute: 0.1 10*3/uL (ref 0.0–0.1)
Basophils Relative: 1 %
Eosinophils Absolute: 0.4 10*3/uL (ref 0.0–0.5)
Eosinophils Relative: 4 %
HCT: 27.6 % — ABNORMAL LOW (ref 36.0–46.0)
Hemoglobin: 8.6 g/dL — ABNORMAL LOW (ref 12.0–15.0)
Immature Granulocytes: 0 %
Lymphocytes Relative: 26 %
Lymphs Abs: 2.7 10*3/uL (ref 0.7–4.0)
MCH: 27 pg (ref 26.0–34.0)
MCHC: 31.2 g/dL (ref 30.0–36.0)
MCV: 86.5 fL (ref 80.0–100.0)
Monocytes Absolute: 1.1 10*3/uL — ABNORMAL HIGH (ref 0.1–1.0)
Monocytes Relative: 10 %
Neutro Abs: 6.2 10*3/uL (ref 1.7–7.7)
Neutrophils Relative %: 59 %
Platelets: 233 10*3/uL (ref 150–400)
RBC: 3.19 MIL/uL — ABNORMAL LOW (ref 3.87–5.11)
RDW: 14.3 % (ref 11.5–15.5)
WBC: 10.5 10*3/uL (ref 4.0–10.5)
nRBC: 0 % (ref 0.0–0.2)

## 2023-06-28 LAB — RENAL FUNCTION PANEL
Albumin: 2.7 g/dL — ABNORMAL LOW (ref 3.5–5.0)
Anion gap: 8 (ref 5–15)
BUN: 12 mg/dL (ref 8–23)
CO2: 20 mmol/L — ABNORMAL LOW (ref 22–32)
Calcium: 8.9 mg/dL (ref 8.9–10.3)
Chloride: 112 mmol/L — ABNORMAL HIGH (ref 98–111)
Creatinine, Ser: 2.77 mg/dL — ABNORMAL HIGH (ref 0.44–1.00)
GFR, Estimated: 19 mL/min — ABNORMAL LOW (ref >60)
Glucose, Bld: 107 mg/dL — ABNORMAL HIGH (ref 70–99)
Phosphorus: 3.1 mg/dL (ref 2.5–4.6)
Potassium: 3.8 mmol/L (ref 3.5–5.1)
Sodium: 140 mmol/L (ref 135–145)

## 2023-06-28 LAB — GLUCOSE, CAPILLARY
Glucose-Capillary: 104 mg/dL — ABNORMAL HIGH (ref 70–99)
Glucose-Capillary: 116 mg/dL — ABNORMAL HIGH (ref 70–99)
Glucose-Capillary: 125 mg/dL — ABNORMAL HIGH (ref 70–99)
Glucose-Capillary: 134 mg/dL — ABNORMAL HIGH (ref 70–99)

## 2023-06-28 LAB — PROTIME-INR
INR: 1.2 (ref 0.8–1.2)
Prothrombin Time: 15.2 s (ref 11.4–15.2)

## 2023-06-28 NOTE — Progress Notes (Signed)
  Progress Note   Patient: Brittany Colon ZOX:[id number] DOB: 1961-06-30 DOA: 06/23/2023     5 DOS: the patient was seen and examined on 06/28/2023 at 10:41AM      Brief hospital course: Mrs. Wence is a 62 y.o. F with hx BrCA s/p lumpectomy, mild DM well controlled, and HLD who presented with 1 month epigastric pain and nausea, and now AKI.     Assessment and Plan: * AKI (acute kidney injury) (HCC) Cr slightly up - Biopsy tomorrow - NPO MN - Follow repeat UA, SPEP, SFLCs, IgG4     Suspsected subacute pancreatitis MRI abdomen normal.  Tolerating solid diet today.  - Outpatient GI follow up     Obesity (BMI 30-39.9) BMI 31  Type 2 diabetes mellitus (HCC) A1c well controlled - Hold metformin - Continue SS corrections  Hyperlipidemia - Continue Crestor          Subjective: No new complaints, feeling well, no fever, no nursing concerns.     Physical Exam: BP 124/76 (BP Location: Right Arm)   Pulse (!) 105   Temp 98.3 F (36.8 C) (Oral)   Resp 16   Ht 5\' 2"  (1.575 m)   Wt 77.1 kg   SpO2 99%   BMI 31.09 kg/m   Adult female, sitting up in chair, interactive and appropriate RRR no murmurs, no LE edema Respiratory rate normal, lungs clear Attention normal, affect normal, lungs clear without rales or wheezes  Data Reviewed: Cr slightly up  Family Communication: None    Disposition: Status is: Inpatient         Author: Alberteen Sam, MD 06/28/2023 1:03 PM  For on call review [email or web address].

## 2023-06-28 NOTE — Progress Notes (Signed)
Admit: 06/23/2023 LOS: 5  79F with progressive subacute kidney disease of unclear etiology with bland urine, poor oral intake with nausea.  Subjective:  MRCP no abnormal findings Decent UOP, SCr 2.8 now For biopsy 11/4 No interval events Quantified UOP not reported  11/02 0701 - 11/03 0700 In: 1060 [P.O.:1060] Out: 1200 [Urine:1200]  Filed Weights   06/23/23 1631  Weight: 77.1 kg    Scheduled Meds:  insulin aspart  0-5 Units Subcutaneous QHS   insulin aspart  0-9 Units Subcutaneous TID WC   pantoprazole  40 mg Oral QAC breakfast   rosuvastatin  20 mg Oral Daily   Continuous Infusions: PRN Meds:.acetaminophen **OR** acetaminophen, diazepam  Current Labs: reviewed   Latest Reference Range & Units 06/24/23 11:38  Protein Creatinine Ratio 0.00 - 0.15 mg/mgCre 0.69 (H)  (H): Data is abnormally high  Physical Exam:  Blood pressure 124/76, pulse (!) 105, temperature 98.3 F (36.8 C), temperature source Oral, resp. rate 16, height 5\' 2"  (1.575 m), weight 77.1 kg, SpO2 99%. Well-appearing, NAD Normal mood and affect Clear bilaterally Regular, normal S1 and S2 No peripheral edema No rashes, no petechia or purpura Soft, nontender  A Subacute kidney injury of unclear etiology with no hematuria or pyuria, UPC of less than 1.  No clear nephrotoxin exposures.  CT abdomen/pelvis with negative imaging for obstruction, mass, stones. Poor oral intake with nausea, EGD largely reassuring. GI workup reassuring, MRCP negative.  Query IgG4 disease, value pending. NAG metabolic acidosis History of breast cancer  P Kidney function continues to slowly decline, I think definitive kidney biopsy is reasonable here as an inpatient, tentative for biopsy 11/4 Rpt UA, check SPEP and SFLC Medication Issues; Preferred narcotic agents for pain control are hydromorphone, fentanyl, and methadone. Morphine should not be used.  Baclofen should be avoided Avoid oral sodium phosphate and magnesium  citrate based laxatives / bowel preps    Sabra Heck MD 06/28/2023, 10:40 AM  Recent Labs  Lab 06/26/23 0413 06/27/23 0413 06/28/23 0437  NA 143 139 140  K 3.8 3.6 3.8  CL 117* 112* 112*  CO2 18* 18* 20*  GLUCOSE 100* 106* 107*  BUN 11 11 12   CREATININE 2.49* 2.61* 2.77*  CALCIUM 8.8* 8.7* 8.9  PHOS  --   --  3.1   Recent Labs  Lab 06/23/23 1344 06/25/23 0435  WBC 9.4 9.0  HGB 10.1* 8.8*  HCT 32.5* 27.6*  MCV 87.4 86.8  PLT 262 230

## 2023-06-28 NOTE — Plan of Care (Signed)
Found that NPO Monday MN order has been discontinued.   NPO order placed again.  Patient to be NPO at Monday MN for possible random renal bx on Monday.    Lynann Bologna Branna Cortina PA-C 06/28/2023 10:39 AM

## 2023-06-29 ENCOUNTER — Inpatient Hospital Stay (HOSPITAL_COMMUNITY): Payer: Medicare Other

## 2023-06-29 DIAGNOSIS — N179 Acute kidney failure, unspecified: Secondary | ICD-10-CM | POA: Diagnosis not present

## 2023-06-29 LAB — BASIC METABOLIC PANEL
Anion gap: 10 (ref 5–15)
BUN: 21 mg/dL (ref 8–23)
CO2: 18 mmol/L — ABNORMAL LOW (ref 22–32)
Calcium: 8.6 mg/dL — ABNORMAL LOW (ref 8.9–10.3)
Chloride: 112 mmol/L — ABNORMAL HIGH (ref 98–111)
Creatinine, Ser: 3.1 mg/dL — ABNORMAL HIGH (ref 0.44–1.00)
GFR, Estimated: 16 mL/min — ABNORMAL LOW (ref >60)
Glucose, Bld: 110 mg/dL — ABNORMAL HIGH (ref 70–99)
Potassium: 3.7 mmol/L (ref 3.5–5.1)
Sodium: 140 mmol/L (ref 135–145)

## 2023-06-29 LAB — GLUCOSE, CAPILLARY: Glucose-Capillary: 129 mg/dL — ABNORMAL HIGH (ref 70–99)

## 2023-06-29 LAB — PROTIME-INR
INR: 1.1 (ref 0.8–1.2)
Prothrombin Time: 14.9 s (ref 11.4–15.2)

## 2023-06-29 MED ORDER — GELATIN ABSORBABLE 12-7 MM EX MISC
1.0000 | Freq: Once | CUTANEOUS | Status: AC
  Administered 2023-06-29: 1 via TOPICAL

## 2023-06-29 MED ORDER — FENTANYL CITRATE (PF) 100 MCG/2ML IJ SOLN
INTRAMUSCULAR | Status: AC
  Filled 2023-06-29: qty 2

## 2023-06-29 MED ORDER — MIDAZOLAM HCL 2 MG/2ML IJ SOLN
INTRAMUSCULAR | Status: AC | PRN
  Administered 2023-06-29: .5 mg via INTRAVENOUS
  Administered 2023-06-29: 1 mg via INTRAVENOUS

## 2023-06-29 MED ORDER — FENTANYL CITRATE (PF) 100 MCG/2ML IJ SOLN
INTRAMUSCULAR | Status: AC | PRN
  Administered 2023-06-29: 50 ug via INTRAVENOUS
  Administered 2023-06-29: 25 ug via INTRAVENOUS

## 2023-06-29 MED ORDER — MIDAZOLAM HCL 2 MG/2ML IJ SOLN
INTRAMUSCULAR | Status: AC
  Filled 2023-06-29: qty 2

## 2023-06-29 MED ORDER — LIDOCAINE HCL (PF) 1 % IJ SOLN
10.0000 mL | Freq: Once | INTRAMUSCULAR | Status: AC
  Administered 2023-06-29: 10 mL via INTRADERMAL

## 2023-06-29 NOTE — Plan of Care (Signed)
  Problem: Education: Goal: Ability to describe self-care measures that may prevent or decrease complications (Diabetes Survival Skills Education) will improve Outcome: Progressing Goal: Individualized Educational Video(s) Outcome: Progressing   Problem: Coping: Goal: Ability to adjust to condition or change in health will improve Outcome: Progressing   Problem: Fluid Volume: Goal: Ability to maintain a balanced intake and output will improve Outcome: Progressing   Problem: Health Behavior/Discharge Planning: Goal: Ability to identify and utilize available resources and services will improve Outcome: Progressing Goal: Ability to manage health-related needs will improve Outcome: Progressing   Problem: Metabolic: Goal: Ability to maintain appropriate glucose levels will improve Outcome: Progressing   Problem: Nutritional: Goal: Maintenance of adequate nutrition will improve Outcome: Progressing Goal: Progress toward achieving an optimal weight will improve Outcome: Progressing   Problem: Skin Integrity: Goal: Risk for impaired skin integrity will decrease Outcome: Progressing   Problem: Tissue Perfusion: Goal: Adequacy of tissue perfusion will improve Outcome: Progressing   Problem: Education: Goal: Knowledge of General Education information will improve Description: Including pain rating scale, medication(s)/side effects and non-pharmacologic comfort measures Outcome: Progressing   Problem: Health Behavior/Discharge Planning: Goal: Ability to manage health-related needs will improve Outcome: Progressing   Problem: Clinical Measurements: Goal: Ability to maintain clinical measurements within normal limits will improve Outcome: Progressing Goal: Will remain free from infection Outcome: Progressing Goal: Diagnostic test results will improve Outcome: Progressing Goal: Respiratory complications will improve Outcome: Progressing Goal: Cardiovascular complication will  be avoided Outcome: Progressing   Problem: Activity: Goal: Risk for activity intolerance will decrease Outcome: Progressing   Problem: Nutrition: Goal: Adequate nutrition will be maintained Outcome: Progressing   Problem: Coping: Goal: Level of anxiety will decrease Outcome: Progressing   Problem: Elimination: Goal: Will not experience complications related to bowel motility Outcome: Progressing Goal: Will not experience complications related to urinary retention Outcome: Progressing   Problem: Pain Management: Goal: General experience of comfort will improve Outcome: Progressing   Problem: Safety: Goal: Ability to remain free from injury will improve Outcome: Progressing   Problem: Skin Integrity: Goal: Risk for impaired skin integrity will decrease Outcome: Progressing   Problem: Education: Goal: Knowledge of disease and its progression will improve Outcome: Progressing   Problem: Health Behavior/Discharge Planning: Goal: Ability to manage health-related needs will improve Outcome: Progressing   Problem: Clinical Measurements: Goal: Complications related to the disease process or treatment will be avoided or minimized Outcome: Progressing   Problem: Activity: Goal: Activity intolerance will improve Outcome: Progressing   Problem: Fluid Volume: Goal: Fluid volume balance will be maintained or improved Outcome: Progressing   Problem: Nutritional: Goal: Ability to make appropriate dietary choices will improve Outcome: Progressing   Problem: Urinary Elimination: Goal: Progression of disease will be identified and treated Outcome: Progressing

## 2023-06-29 NOTE — TOC CM/SW Note (Signed)
Transition of Care [facility]) - Inpatient Brief Assessment   Patient Details  Name: ARLYNE BRANDES MRN: [id number] Date of Birth: 1961-01-15  Transition of Care [facility]) CM/SW Contact:    Tom-Johnson, Hershal Coria, RN Phone Number: 06/29/2023, 12:34 PM   Clinical Narrative:  Patient presented to the ED with abnormal Kidney function from PCP office. Admitted with AKI. Undergoing Renal Biopsy today for slightly worsen Kidney function. Nephrology, GI following.   From home with husband, has two supportive children. Independent with care and drive self prior to admission. Does not have DME's at home.  PCP is Georgianne Fick, MD and uses [facility] on [address].   No TOC needs or recommendations noted at this time.  Awaiting Nephrology to clear for discharge.  CM will continue to follow as patient progresses with care towards discharge.         Transition of Care Asessment: Insurance and Status: Insurance coverage has been reviewed Patient has primary care physician: Yes Home environment has been reviewed: Yes Prior level of function:: Independent Prior/Current Home Services: No current home services Social Determinants of Health Reivew: SDOH reviewed no interventions necessary Readmission risk has been reviewed: Yes Transition of care needs: no transition of care needs at this time

## 2023-06-29 NOTE — Progress Notes (Signed)
  Progress Note   Patient: Brittany Colon QMV:[id number] DOB: [birth year] DOA: 06/23/2023     6 DOS: the patient was seen and examined on 06/29/2023       Brief hospital course: Brittany Colon is a 62 y.o. F with hx BrCA s/p lumpectomy, mild DM well controlled, and HLD who presented with 1 month epigastric pain and nausea, and now AKI.     Assessment and Plan: * AKI (acute kidney injury) (HCC) Biopsy today Creatinine up slightly Doing well postbiopsy - Observe overnight - Trend creatinine - Follow-up preliminary biopsy results     Suspsected subacute pancreatitis MRCP normal.  This appears to be resolved, tolerating diet well.    Obesity (BMI 30-39.9) BMI 31  Type 2 diabetes mellitus (HCC) Glucose excellent - Hold metformin - Continue sliding scale corrections  Hyperlipidemia - Continue Crestor          Subjective: Patient is feeling well, eating well.       Physical Exam: BP 124/76 (BP Location: Right Arm)   Pulse 81   Temp 98.5 F (36.9 C) (Oral)   Resp 13   Ht 5\' 2"  (1.575 m)   Wt 77.1 kg   SpO2 99%   BMI 31.09 kg/m   Adult female, sitting up in recliner, interactive and appropriate, Face symmetric, speech fluent, affect pleasant  Data Reviewed: Patient metabolic panel shows creatinine slightly up to 3.1  Family Communication: Daughter at the bedside    Disposition: Status is: Inpatient         Author: Alberteen Sam, MD 06/29/2023 5:17 PM  For on call review [email or web address].

## 2023-06-29 NOTE — Care Management Important Message (Signed)
Important Message  Patient Details  Name: Brittany Colon MRN: [id number] Date of Birth: April 29, 1961   Important Message Given:  Yes - Medicare IM     Dorena Bodo 06/29/2023, 3:27 PM

## 2023-06-29 NOTE — Procedures (Signed)
Interventional Radiology Procedure:   Indications: Subacute kidney injury  Procedure: US guided left renal biopsy  Findings: 2 core biopsies from left kidney lower pole.  No immediate bleeding or hematoma.  Gelfoam slurry injected along biopsy tract.   Complications: None     EBL: Minimal  Plan: Strict bedrest 4 hours  Mayley Lish R. Lowella Dandy, MD  Pager: [phone]

## 2023-06-29 NOTE — Progress Notes (Signed)
Reason for Consult: AKI unclear etiology Referring Physician: Dr. Maryfrances Bunnell, MD  Brittany Colon is an 62 y.o. female with PMHx significant for right sided BrCA s/p lumpectomy, mild DM well controlled, and HLD who presented with 1 month epigastric pain and nausea, and now AKI.   Subjective: Patient evaluated s/p renal biopsy performed this morning with IR. The procedure went well. Patient is resting in hospital bed in no acute distress. Patient states that her brother has ESRD and is a patient of [facility] in [place]. He had a renal transplant at [facility], and patient states his renal failure stemmed from lifestyle with HTN, T2DM, etc. Otherwise, no family history of kidney disease that she is aware of. She was encouraged to continue trying to eat and drink today. Patient informed that further labs and the renal biopsy will guide the plan going forward. Patient denies chest pain, shortness of breath.  Trend in Creatinine:  Creatinine, Ser  Date/Time Value Ref Range Status  06/29/2023 04:41 AM 3.10 (H) 0.44 - 1.00 mg/dL Final  16/96/7893 81:01 AM 2.77 (H) 0.44 - 1.00 mg/dL Final  75/05/2584 27:78 AM 2.61 (H) 0.44 - 1.00 mg/dL Final  24/23/5361 44:31 AM 2.49 (H) 0.44 - 1.00 mg/dL Final  54/00/8676 19:50 PM 2.52 (H) 0.44 - 1.00 mg/dL Final  93/26/7124 58:09 AM 2.35 (H) 0.44 - 1.00 mg/dL Final  98/33/8250 53:97 AM 2.27 (H) 0.44 - 1.00 mg/dL Final  67/34/1937 90:24 PM 2.38 (H) 0.44 - 1.00 mg/dL Final  09/73/5329 92:42 PM 0.70 0.44 - 1.00 mg/dL Final  68/34/1962 22:97 PM 0.68 0.44 - 1.00 mg/dL Final    PMH:   Past Medical History:  Diagnosis Date   Allergy    Breast cancer (HCC) 12/14/12   right   Breast cyst 12/18/12   mult bilaterally per MRI   Headache(784.0)    History of radiation therapy 02/09/2013-03/31/2013   62.4 gray to the right breast   Personal history of radiation therapy 2014   Right Breast Cancer    PSH:   Past Surgical History:  Procedure Laterality Date   BREAST  BIOPSY Right 2014   BREAST LUMPECTOMY Right 2014   BREAST LUMPECTOMY WITH NEEDLE LOCALIZATION Right 01/10/2013   Procedure: BREAST LUMPECTOMY WITH NEEDLE LOCALIZATION;  Surgeon: Mariella Saa, MD;  Location: [facility];  Service: General;  Laterality: Right;   CARPAL TUNNEL RELEASE Right    FOOT SURGERY  1993   bone spur lt foot   KNEE SURGERY Bilateral     Allergies:  Allergies  Allergen Reactions   Elemental Sulfur     vomiting   Sulfa Antibiotics     Medications:   Prior to Admission medications   Medication Sig Start Date End Date Taking? Authorizing Provider  acetaminophen (TYLENOL) 500 MG tablet Take 1,000 mg by mouth every 8 (eight) hours as needed for mild pain (pain score 1-3) or moderate pain (pain score 4-6).   Yes [provider]  amoxicillin-clavulanate (AUGMENTIN) 875-125 MG tablet Take 1 tablet by mouth 2 (two) times daily. 06/08/23  Yes [provider]  calcium carbonate (SUPER CALCIUM) 1500 (600 Ca) MG TABS tablet Take 600 mg by mouth daily.   Yes [provider]  ibuprofen (ADVIL) 800 MG tablet Take 800 mg by mouth every 8 (eight) hours as needed for mild pain (pain score 1-3) or moderate pain (pain score 4-6).   Yes [provider]  Melaton-Thean-Cham-PassF-LBalm (MELATONIN + L-THEANINE PO) Take 1 capsule by mouth at bedtime  as needed.   Yes [provider]  metFORMIN (GLUCOPHAGE-XR) 500 MG 24 hr tablet Take 500 mg by mouth See admin instructions. Take 1 tablet by mouth with evening meal every day.   Yes [provider]  Multiple Vitamins-Minerals (MULTIVITAMIN PO) Take by mouth daily.   Yes [provider]  ondansetron (ZOFRAN) 8 MG tablet Take 1 tablet (8 mg total) by mouth every 8 (eight) hours as needed for nausea or vomiting. 07/07/18  Yes Rodriguez-Southworth, Nettie Elm, PA-C  pantoprazole (PROTONIX) 40 MG tablet Take 40 mg by mouth 2 (two) times daily. 06/15/23  Yes [provider]  rosuvastatin (CRESTOR) 20 MG tablet Take 1 tablet by mouth daily. 03/18/21  Yes [provider]  nystatin (MYCOSTATIN) 100000 UNIT/ML suspension Take 5 mLs by mouth 4 (four) times daily. 06/08/23   [provider]    Inpatient medications:  insulin aspart  0-5 Units Subcutaneous QHS   insulin aspart  0-9 Units Subcutaneous TID WC   pantoprazole  40 mg Oral QAC breakfast   rosuvastatin  20 mg Oral Daily    Discontinued Meds:   Medications Discontinued During This Encounter  Medication Reason   celecoxib (CELEBREX) 200 MG capsule Patient Preference   metroNIDAZOLE (FLAGYL) 500 MG tablet Patient Preference   Glucosamine HCl (GLUCOSAMINE PO) Patient Preference   Ferrous Sulfate (IRON PO) Patient Preference   cefUROXime (CEFTIN) 250 MG tablet Patient Preference   tamoxifen (NOLVADEX) 20 MG tablet Patient Preference   pantoprazole (PROTONIX) EC tablet 40 mg    heparin injection 5,000 Units    heparin injection 5,000 Units     Social History:  reports that she has never smoked. She does not have any smokeless tobacco history on file. She reports that she does not drink alcohol and does not use drugs.  Family History:   Family History  Problem Relation Age of Onset   Heart disease Mother     Pertinent items noted in HPI and remainder of comprehensive ROS otherwise negative. Weight change:   Intake/Output Summary (Last 24 hours) at 06/29/2023 1221 Last data filed at 06/29/2023 0900 Gross per 24 hour  Intake 480 ml  Output 900 ml  Net -420 ml   BP 124/76 (BP Location: Right Arm)   Pulse 81   Temp 98.5 F (36.9 C) (Oral)   Resp 13   Ht 5\' 2"  (1.575 m)   Wt 77.1 kg   SpO2 99%   BMI 31.09 kg/m  Vitals:   06/29/23 1035 06/29/23 1040 06/29/23 1042 06/29/23 1102  BP: 118/71 126/73  124/76  Pulse: 79 88 82 81  Resp: 13 (!) 24 13   Temp:    98.5 F (36.9 C)  TempSrc:    Oral  SpO2: 100% 100% 99% 99%  Weight:      Height:         Physical  Exam: General: Laying in bed, NAD.  Head: No apparent abnormalities. CV: RRR. No rub. Lungs: CTAB to anterior lung fields. Room air. Abd: Soft, non-tender to palpation of four quadrants. Mild tenderness to palpation of epigastrium. No guarding or rebound. Extremities: No peripheral edema noted.  Skin: No petichiae or purpura noted.  Psych: Mood and affect appropriate.   Labs: Basic Metabolic Panel: Recent Labs  Lab 06/23/23 1344 06/24/23 0704 06/25/23 0435 06/25/23 1907 06/26/23 0413 06/27/23 0413 06/28/23 0437 06/29/23 0441  NA 142 139 145 141 143 139 140 140  K 3.6 3.3* 3.9 3.7 3.8 3.6 3.8 3.7  CL 109 114* 117* 113* 117* 112* 112* 112*  CO2 19* 21* 20* 19* 18* 18* 20* 18*  GLUCOSE 80 95 102* 119* 100* 106* 107* 110*  BUN 21 16 13 13 11 11 12 21   CREATININE 2.38* 2.27* 2.35* 2.52* 2.49* 2.61* 2.77* 3.10*  ALBUMIN 3.1*  --   --   --   --   --  2.7*  --   CALCIUM 9.2 8.4* 8.8* 8.9 8.8* 8.7* 8.9 8.6*  PHOS  --   --   --   --   --   --  3.1  --    Liver Function Tests: Recent Labs  Lab 06/23/23 1344 06/28/23 0437  AST 19  --   ALT 15  --   ALKPHOS 74  --   BILITOT 0.1*  --   PROT 8.1  --   ALBUMIN 3.1* 2.7*   Recent Labs  Lab 06/23/23 1344  LIPASE 38   No results for input(s): "AMMONIA" in the last 168 hours. CBC: Recent Labs  Lab 06/23/23 1344 06/25/23 0435 06/28/23 2224  WBC 9.4 9.0 10.5  NEUTROABS  --   --  6.2  HGB 10.1* 8.8* 8.6*  HCT 32.5* 27.6* 27.6*  MCV 87.4 86.8 86.5  PLT 262 230 233   PT/INR: @LABRCNTIP (inr:5) Cardiac Enzymes: )No results for input(s): "CKTOTAL", "CKMB", "CKMBINDEX", "TROPONINI" in the last 168 hours. CBG: Recent Labs  Lab 06/27/23 2202 06/28/23 0743 06/28/23 1113 06/28/23 1623 06/28/23 2115  GLUCAP 92 104* 116* 125* 134*    Iron Studies:  Recent Labs  Lab 06/24/23 0704  IRON 25*  TIBC 217*  FERRITIN 204    Xrays/Other Studies: US BIOPSY (KIDNEY)  Result Date: 06/29/2023 INDICATION: 62 year old with  subacute kidney injury. Request for random renal biopsy. EXAM: ULTRASOUND-GUIDED RANDOM RENAL BIOPSY MEDICATIONS: Moderate sedation ANESTHESIA/SEDATION: Moderate (conscious) sedation was employed during this procedure. A total of Versed 1.5 mg and Fentanyl 75 mcg was administered intravenously by the radiology nurse. Total intra-service moderate Sedation Time: 22 minutes. The patient's level of consciousness and vital signs were monitored continuously by radiology nursing throughout the procedure under my direct supervision. FLUOROSCOPY TIME:  None COMPLICATIONS: None immediate. PROCEDURE: Informed written consent was obtained from the patient after a thorough discussion of the procedural risks, benefits and alternatives. All questions were addressed. A timeout was performed prior to the initiation of the procedure. Patient was placed prone. Both kidneys were evaluated with ultrasound. Left kidney was selected for biopsy. Left flank was prepped and draped in sterile fashion. Maximal barrier sterile technique was utilized including caps, mask, sterile gowns, sterile gloves, sterile drape, hand hygiene and skin antiseptic. Skin was anesthetized using 1% lidocaine. Small incision was made. Using ultrasound guidance, a 17 gauge coaxial needle was directed to the lower pole cortex. Two core biopsies were obtained from the lower pole cortex with an 18 gauge core device. Specimens placed in saline. Gel-Foam slurry was injected as the 17 gauge coaxial needle was retracted. Bandage placed over the puncture site. FINDINGS: 2 adequate core biopsy specimens were obtained. No immediate bleeding or hematoma formation. IMPRESSION: Successful ultrasound-guided left renal biopsy. Electronically Signed   By: Richarda Overlie M.D.   On: 06/29/2023 11:44   MR ABDOMEN MRCP W WO CONTAST  Result Date: 06/27/2023 CLINICAL DATA:  Epigastric and right upper quadrant pain. Pancreatitis. EXAM: MRI ABDOMEN WITHOUT AND WITH CONTRAST (INCLUDING  MRCP) TECHNIQUE: Multiplanar multisequence MR imaging of the abdomen was performed both before and after the administration  of intravenous contrast. Heavily T2-weighted images of the biliary and pancreatic ducts were obtained, and three-dimensional MRCP images were rendered by post processing. CONTRAST:  7mL GADAVIST GADOBUTROL 1 MMOL/ML IV SOLN COMPARISON:  None Available. FINDINGS: Lower chest: No acute findings. Hepatobiliary: No hepatic masses identified. Gallbladder is unremarkable. No evidence of biliary ductal dilatation, with common bile duct measuring 5 mm in diameter. No evidence of choledocholithiasis. Pancreas: No evidence of pancreatic mass. No definite evidence of pancreatic edema or peripancreatic inflammatory changes. No evidence of pancreatic ductal dilatation or pancreas divisum. Spleen:  Within normal limits in size and appearance. Adrenals/Urinary Tract: No suspicious masses identified. No evidence of hydronephrosis. Stomach/Bowel: Unremarkable. Vascular/Lymphatic: No pathologically enlarged lymph nodes identified. No acute vascular findings. Other:  None. Musculoskeletal:  No suspicious bone lesions identified. IMPRESSION: No radiographic evidence of pancreatitis or other acute findings. No evidence of biliary ductal dilatation or choledocholithiasis. Electronically Signed   By: Danae Orleans M.D.   On: 06/27/2023 15:47   MR 3D Recon At Scanner  Result Date: 06/27/2023 CLINICAL DATA:  Epigastric and right upper quadrant pain. Pancreatitis. EXAM: MRI ABDOMEN WITHOUT AND WITH CONTRAST (INCLUDING MRCP) TECHNIQUE: Multiplanar multisequence MR imaging of the abdomen was performed both before and after the administration of intravenous contrast. Heavily T2-weighted images of the biliary and pancreatic ducts were obtained, and three-dimensional MRCP images were rendered by post processing. CONTRAST:  7mL GADAVIST GADOBUTROL 1 MMOL/ML IV SOLN COMPARISON:  None Available. FINDINGS: Lower chest: No  acute findings. Hepatobiliary: No hepatic masses identified. Gallbladder is unremarkable. No evidence of biliary ductal dilatation, with common bile duct measuring 5 mm in diameter. No evidence of choledocholithiasis. Pancreas: No evidence of pancreatic mass. No definite evidence of pancreatic edema or peripancreatic inflammatory changes. No evidence of pancreatic ductal dilatation or pancreas divisum. Spleen:  Within normal limits in size and appearance. Adrenals/Urinary Tract: No suspicious masses identified. No evidence of hydronephrosis. Stomach/Bowel: Unremarkable. Vascular/Lymphatic: No pathologically enlarged lymph nodes identified. No acute vascular findings. Other:  None. Musculoskeletal:  No suspicious bone lesions identified. IMPRESSION: No radiographic evidence of pancreatitis or other acute findings. No evidence of biliary ductal dilatation or choledocholithiasis. Electronically Signed   By: Danae Orleans M.D.   On: 06/27/2023 15:47     Assessment/Plan:  Subacute non-oliguric kidney injury of unclear etiology  Initial UA with no hematuria or pyuria, UPC of less than 1.  No clear nephrotoxin exposures.  CT abdomen/pelvis with negative imaging for obstruction, mass, stones. Kidney function continues to slowly decline. Crt today of 3.10. See trend above. Definitive kidney biopsy completed this morning 11/04 with IR. Biopsy results will hopefully guide further treatment. Repeat UA with more evidence suggestive of nephritic syndrome with: small Hbg, 30 protein, trace leukocyte esterase, 0-5 RBC, and 6-10 WBC.  SPEP and SFLC results not released. Query IgG4 disease, value pending (per GI team) Additional autoimmune labs ordered today including: ANA, ANCA, GBM, C3, C4, ds-DNA, ASO titer, ordered due to new evidence of hematuria and protein on repeat UA.  Patient making urine -420 mL today. Patient encouraged to eat and drink as tolerated. Poor oral intake with nausea EGD largely reassuring. GI  workup reassuring, MRCP negative.   Anemia Hbg continuing to downtrend. Most recent Hbg of 8.6. Follow daily CBC. PRBC transfusion for Hbg <7.0. NAG metabolic acidosis History of breast cancer Medication Issues; Preferred narcotic agents for pain control are hydromorphone, fentanyl, and methadone. Morphine should not be used.  Baclofen should be avoided Avoid oral sodium phosphate and  magnesium citrate based laxatives / bowel preps    Theador Hawthorne Pflum PA student 06/29/2023, 12:21 PM   I have seen and examined this patient and agree with plan and assessment in the above note with renal recommendations/intervention highlighted. Will need to follow renal function and await kidney biopsy prior to discharge.  Will also need to follow H/H following kidney biopsy and observe for any signs of bleeding post procedure.  SPEP and light chains pending. Jomarie Longs A Amayrani Bennick,MD 06/29/2023 1:30 PM

## 2023-06-30 DIAGNOSIS — N179 Acute kidney failure, unspecified: Secondary | ICD-10-CM | POA: Diagnosis not present

## 2023-06-30 LAB — C4 COMPLEMENT: Complement C4, Body Fluid: 67 mg/dL — ABNORMAL HIGH (ref 12–38)

## 2023-06-30 LAB — PROTEIN ELECTROPHORESIS, SERUM
A/G Ratio: 0.7 (ref 0.7–1.7)
Albumin ELP: 3 g/dL (ref 2.9–4.4)
Alpha-1-Globulin: 0.4 g/dL (ref 0.0–0.4)
Alpha-2-Globulin: 1.1 g/dL — ABNORMAL HIGH (ref 0.4–1.0)
Beta Globulin: 1.2 g/dL (ref 0.7–1.3)
Gamma Globulin: 1.8 g/dL (ref 0.4–1.8)
Globulin, Total: 4.5 g/dL — ABNORMAL HIGH (ref 2.2–3.9)
Total Protein ELP: 7.5 g/dL (ref 6.0–8.5)

## 2023-06-30 LAB — RENAL FUNCTION PANEL
Albumin: 2.6 g/dL — ABNORMAL LOW (ref 3.5–5.0)
Anion gap: 8 (ref 5–15)
BUN: 24 mg/dL — ABNORMAL HIGH (ref 8–23)
CO2: 19 mmol/L — ABNORMAL LOW (ref 22–32)
Calcium: 8.7 mg/dL — ABNORMAL LOW (ref 8.9–10.3)
Chloride: 113 mmol/L — ABNORMAL HIGH (ref 98–111)
Creatinine, Ser: 3.11 mg/dL — ABNORMAL HIGH (ref 0.44–1.00)
GFR, Estimated: 16 mL/min — ABNORMAL LOW (ref >60)
Glucose, Bld: 106 mg/dL — ABNORMAL HIGH (ref 70–99)
Phosphorus: 2.9 mg/dL (ref 2.5–4.6)
Potassium: 4 mmol/L (ref 3.5–5.1)
Sodium: 140 mmol/L (ref 135–145)

## 2023-06-30 LAB — ANA W/REFLEX IF POSITIVE: Anti Nuclear Antibody (ANA): NEGATIVE

## 2023-06-30 LAB — CBC
HCT: 27.6 % — ABNORMAL LOW (ref 36.0–46.0)
Hemoglobin: 8.8 g/dL — ABNORMAL LOW (ref 12.0–15.0)
MCH: 27.2 pg (ref 26.0–34.0)
MCHC: 31.9 g/dL (ref 30.0–36.0)
MCV: 85.2 fL (ref 80.0–100.0)
Platelets: 251 10*3/uL (ref 150–400)
RBC: 3.24 MIL/uL — ABNORMAL LOW (ref 3.87–5.11)
RDW: 14.4 % (ref 11.5–15.5)
WBC: 9.8 10*3/uL (ref 4.0–10.5)
nRBC: 0 % (ref 0.0–0.2)

## 2023-06-30 LAB — GLUCOSE, CAPILLARY: Glucose-Capillary: 96 mg/dL (ref 70–99)

## 2023-06-30 LAB — IGG, IGA, IGM
IgA: 305 mg/dL (ref 87–352)
IgG (Immunoglobin G), Serum: 2116 mg/dL — ABNORMAL HIGH (ref 586–1602)
IgM (Immunoglobulin M), Srm: 130 mg/dL (ref 26–217)

## 2023-06-30 LAB — EXTRACTABLE NUCLEAR ANTIGEN ANTIBODY
ENA SM Ab Ser-aCnc: <0.2 AI (ref 0.0–0.9)
Ribonucleic Protein: <0.2 AI (ref 0.0–0.9)
SSA (Ro) (ENA) Antibody, IgG: <0.2 AI (ref 0.0–0.9)
SSB (La) (ENA) Antibody, IgG: <0.2 AI (ref 0.0–0.9)
Scleroderma (Scl-70) (ENA) Antibody, IgG: <0.2 AI (ref 0.0–0.9)
ds DNA Ab: 1 [IU]/mL (ref 0–9)

## 2023-06-30 LAB — C3 COMPLEMENT: C3 Complement: 198 mg/dL — ABNORMAL HIGH (ref 82–167)

## 2023-06-30 LAB — KAPPA/LAMBDA LIGHT CHAINS
Kappa free light chain: 114.4 mg/L — ABNORMAL HIGH (ref 3.3–19.4)
Kappa, lambda light chain ratio: 2.67 — ABNORMAL HIGH (ref 0.26–1.65)
Lambda free light chains: 42.9 mg/L — ABNORMAL HIGH (ref 5.7–26.3)

## 2023-06-30 LAB — ANTI-JO 1 ANTIBODY, IGG: Anti JO-1: <0.2 AI (ref 0.0–0.9)

## 2023-06-30 LAB — ANTISTREPTOLYSIN O TITER: ASO: 30 [IU]/mL (ref 0.0–200.0)

## 2023-06-30 NOTE — Discharge Summary (Signed)
Physician Discharge Summary   Patient: Brittany Colon MRN: [id number] DOB: [birth year]  Admit date:     06/23/2023  Discharge date: 06/30/23  Discharge Physician: Alberteen Sam   PCP: Georgianne Fick, MD     Recommendations at discharge:  Follow up with Nephrology Dr. Marisue Humble as planned Dr. Marisue Humble: Follow up IgG4 level, renal biopsy, ANCA  Dr. Rhea Belton to follow up gastric biopsy from 11/1     Discharge Diagnoses: Principal Problem:   AKI (acute kidney injury) (HCC) Active Problems:   Suspsected subacute pancreatitis   Hyperlipidemia   Type 2 diabetes mellitus (HCC)   Obesity (BMI 30-39.9)       Hospital Course: Brittany Colon is a 62 y.o. F with hx BrCA s/p lumpectomy, mild DM well controlled, and HLD who presented with 1 month epigastric pain and nausea, and now AKI.   * AKI (acute kidney injury) (HCC) Baseline Cr 0.74 mg/dL last May. Here was 2.3 > 3.1 at discharge  Did not respond to fluids, US renal unremarkable, UA bland.    SPEP without Mspike, SLCs diffusely elevated.  IgG level elevated relative to IgA and IgM, ANA negative, Anti-Jo, antistreptolysin negative.  Underwent renal biopsy.  Given stability, Nephrology will await biopsy and IgG4 results and start steroids if appropriate as an outpatient.        Suspsected subacute pancreatitis GI consulted.  MRI abdomen with MRCP obtained and ruled out mass lesion.    Underwent EGD and this was reassuring.  Biopsy pending to be followed up by GI Dr. Rhea Belton.  No specific GI follow up needed unless IGG4 abnormal.            The [facility] Controlled Substances Registry was reviewed for this patient prior to discharge.  Consultants: Gastroenterology, Dr. Rhea Belton Nephrology, Dr. Marisue Humble and [facility]  Procedures performed:  EGD MRI abdomen Kidney biopsy   Disposition: Home Diet recommendation:  Regular diet   DISCHARGE MEDICATION: Allergies as of 06/30/2023       Reactions    Elemental Sulfur    vomiting   Sulfa Antibiotics         Medication List     STOP taking these medications    amoxicillin-clavulanate 875-125 MG tablet Commonly known as: AUGMENTIN   ibuprofen 800 MG tablet Commonly known as: ADVIL   metFORMIN 500 MG 24 hr tablet Commonly known as: GLUCOPHAGE-XR       TAKE these medications    acetaminophen 500 MG tablet Commonly known as: TYLENOL Take 1,000 mg by mouth every 8 (eight) hours as needed for mild pain (pain score 1-3) or moderate pain (pain score 4-6).   MELATONIN + L-THEANINE PO Take 1 capsule by mouth at bedtime as needed.   MULTIVITAMIN PO Take by mouth daily.   nystatin 100000 UNIT/ML suspension Commonly known as: MYCOSTATIN Take 5 mLs by mouth 4 (four) times daily.   ondansetron 8 MG tablet Commonly known as: Zofran Take 1 tablet (8 mg total) by mouth every 8 (eight) hours as needed for nausea or vomiting.   pantoprazole 40 MG tablet Commonly known as: PROTONIX Take 40 mg by mouth 2 (two) times daily.   rosuvastatin 20 MG tablet Commonly known as: CRESTOR Take 1 tablet by mouth daily.   Super Calcium 1500 (600 Ca) MG Tabs tablet Generic drug: calcium carbonate Take 600 mg by mouth daily.        Follow-up Information     Arita Miss, MD. Schedule an appointment as soon as  possible for a visit.   Specialty: Nephrology Contact information: [address] [place] [address] [phone]         Georgianne Fick, MD. Schedule an appointment as soon as possible for a visit in 1 week(s).   Specialty: Internal Medicine Contact information: [address] [place] [address] [place] [address] [phone]                 Discharge Instructions     Discharge instructions   Complete by: As directed    **IMPORTANT DISCHARGE INSTRUCTIONS**   From Dr. Maryfrances Bunnell: You were evaluated for kidney injury and abdominal pain  The kidney injury is unusual and will require biopsy  to diagnose.    Dr. Marisue Humble and [facility] office is arranging a follow up appointment for you (see below in To Do section) Call them if you haven't heard from them in 2 days   Go see Dr. Nicholos Johns in a week  AVOID over the counter pain medicines like ibuprofen, Motrin, Advil, Aleve, or naproxen These are NSAIDs and you should avoid them  AVOID omeprazole or Prilosec  For now, hold your metformin   Increase activity slowly   Complete by: As directed    No wound care   Complete by: As directed        Discharge Exam: Filed Weights   06/23/23 1631  Weight: 77.1 kg    General: Pt is alert, awake, not in acute distress Cardiovascular: Mild nonpitting LE edema.   Respiratory: Normal respiratory rate and rhythm.    Neuro/Psych: Strength symmetric in upper and lower extremities.  Judgment and insight appear normal.   Condition at discharge: good  The results of significant diagnostics from this hospitalization (including imaging, microbiology, ancillary and laboratory) are listed below for reference.   Imaging Studies: US BIOPSY (KIDNEY)  Result Date: 06/29/2023 INDICATION: 62 year old with subacute kidney injury. Request for random renal biopsy. EXAM: ULTRASOUND-GUIDED RANDOM RENAL BIOPSY MEDICATIONS: Moderate sedation ANESTHESIA/SEDATION: Moderate (conscious) sedation was employed during this procedure. A total of Versed 1.5 mg and Fentanyl 75 mcg was administered intravenously by the radiology nurse. Total intra-service moderate Sedation Time: 22 minutes. The patient's level of consciousness and vital signs were monitored continuously by radiology nursing throughout the procedure under my direct supervision. FLUOROSCOPY TIME:  None COMPLICATIONS: None immediate. PROCEDURE: Informed written consent was obtained from the patient after a thorough discussion of the procedural risks, benefits and alternatives. All questions were addressed. A timeout was performed prior to the  initiation of the procedure. Patient was placed prone. Both kidneys were evaluated with ultrasound. Left kidney was selected for biopsy. Left flank was prepped and draped in sterile fashion. Maximal barrier sterile technique was utilized including caps, mask, sterile gowns, sterile gloves, sterile drape, hand hygiene and skin antiseptic. Skin was anesthetized using 1% lidocaine. Small incision was made. Using ultrasound guidance, a 17 gauge coaxial needle was directed to the lower pole cortex. Two core biopsies were obtained from the lower pole cortex with an 18 gauge core device. Specimens placed in saline. Gel-Foam slurry was injected as the 17 gauge coaxial needle was retracted. Bandage placed over the puncture site. FINDINGS: 2 adequate core biopsy specimens were obtained. No immediate bleeding or hematoma formation. IMPRESSION: Successful ultrasound-guided left renal biopsy. Electronically Signed   By: Richarda Overlie M.D.   On: 06/29/2023 11:44   MR ABDOMEN MRCP W WO CONTAST  Result Date: 06/27/2023 CLINICAL DATA:  Epigastric and right upper quadrant pain. Pancreatitis. EXAM: MRI ABDOMEN WITHOUT  AND WITH CONTRAST (INCLUDING MRCP) TECHNIQUE: Multiplanar multisequence MR imaging of the abdomen was performed both before and after the administration of intravenous contrast. Heavily T2-weighted images of the biliary and pancreatic ducts were obtained, and three-dimensional MRCP images were rendered by post processing. CONTRAST:  7mL GADAVIST GADOBUTROL 1 MMOL/ML IV SOLN COMPARISON:  None Available. FINDINGS: Lower chest: No acute findings. Hepatobiliary: No hepatic masses identified. Gallbladder is unremarkable. No evidence of biliary ductal dilatation, with common bile duct measuring 5 mm in diameter. No evidence of choledocholithiasis. Pancreas: No evidence of pancreatic mass. No definite evidence of pancreatic edema or peripancreatic inflammatory changes. No evidence of pancreatic ductal dilatation or pancreas  divisum. Spleen:  Within normal limits in size and appearance. Adrenals/Urinary Tract: No suspicious masses identified. No evidence of hydronephrosis. Stomach/Bowel: Unremarkable. Vascular/Lymphatic: No pathologically enlarged lymph nodes identified. No acute vascular findings. Other:  None. Musculoskeletal:  No suspicious bone lesions identified. IMPRESSION: No radiographic evidence of pancreatitis or other acute findings. No evidence of biliary ductal dilatation or choledocholithiasis. Electronically Signed   By: Danae Orleans M.D.   On: 06/27/2023 15:47   MR 3D Recon At Scanner  Result Date: 06/27/2023 CLINICAL DATA:  Epigastric and right upper quadrant pain. Pancreatitis. EXAM: MRI ABDOMEN WITHOUT AND WITH CONTRAST (INCLUDING MRCP) TECHNIQUE: Multiplanar multisequence MR imaging of the abdomen was performed both before and after the administration of intravenous contrast. Heavily T2-weighted images of the biliary and pancreatic ducts were obtained, and three-dimensional MRCP images were rendered by post processing. CONTRAST:  7mL GADAVIST GADOBUTROL 1 MMOL/ML IV SOLN COMPARISON:  None Available. FINDINGS: Lower chest: No acute findings. Hepatobiliary: No hepatic masses identified. Gallbladder is unremarkable. No evidence of biliary ductal dilatation, with common bile duct measuring 5 mm in diameter. No evidence of choledocholithiasis. Pancreas: No evidence of pancreatic mass. No definite evidence of pancreatic edema or peripancreatic inflammatory changes. No evidence of pancreatic ductal dilatation or pancreas divisum. Spleen:  Within normal limits in size and appearance. Adrenals/Urinary Tract: No suspicious masses identified. No evidence of hydronephrosis. Stomach/Bowel: Unremarkable. Vascular/Lymphatic: No pathologically enlarged lymph nodes identified. No acute vascular findings. Other:  None. Musculoskeletal:  No suspicious bone lesions identified. IMPRESSION: No radiographic evidence of pancreatitis or  other acute findings. No evidence of biliary ductal dilatation or choledocholithiasis. Electronically Signed   By: Danae Orleans M.D.   On: 06/27/2023 15:47   CT ABDOMEN PELVIS WO CONTRAST  Result Date: 06/23/2023 CLINICAL DATA:  Epigastric pain EXAM: CT ABDOMEN AND PELVIS WITHOUT CONTRAST TECHNIQUE: Multidetector CT imaging of the abdomen and pelvis was performed following the standard protocol without IV contrast. RADIATION DOSE REDUCTION: This exam was performed according to the departmental dose-optimization program which includes automated exposure control, adjustment of the mA and/or kV according to patient size and/or use of iterative reconstruction technique. COMPARISON:  None Available. FINDINGS: Lower chest: No acute abnormality. Hepatobiliary: Unremarkable. Pancreas: Question edema within the pancreas and possible subtle haziness of the peripancreatic fat. Spleen: Unremarkable. Adrenals/Urinary Tract: Unremarkable adrenal glands and bladder. No urinary calculi or hydronephrosis. Stomach/Bowel: No obstruction or bowel wall thickening. Vascular/Lymphatic: No significant vascular findings are present. No enlarged abdominal or pelvic lymph nodes. Reproductive: Fibroid uterus.  No adnexal mass. Other: No free intraperitoneal fluid or air. Musculoskeletal: No acute fracture. IMPRESSION: 1. Question edema within the pancreas and possible subtle haziness of the peripancreatic fat. This may be within normal limits though correlation with lipase is recommended. 2. Fibroid uterus. Electronically Signed   By: Angelique Holm.D.  On: 06/23/2023 19:19    Microbiology: Results for orders placed or performed during the hospital encounter of 11/21/08  Wet prep, genital     Status: Abnormal   Collection Time: 11/21/08 10:58 AM  Result Value Ref Range Status   Yeast Wet Prep HPF POC NONE SEEN NONE SEEN Final   Trich, Wet Prep NONE SEEN NONE SEEN Final   Clue Cells Wet Prep HPF POC TOO NUMEROUS TO COUNT (A)  NONE SEEN Final   WBC, Wet Prep HPF POC TOO NUMEROUS TO COUNT (A) NONE SEEN Final  GC/chlamydia probe amp, genital     Status: None   Collection Time: 11/21/08 10:58 AM  Result Value Ref Range Status   GC Probe Amp, Genital  NEGATIVE Final    NEGATIVE (NOTE)  Testing performed using the BD Probetec ET Chlamydia trachomatis and Neisseria gonorrhea amplified DNA assay.   Chlamydia, DNA Probe  NEGATIVE Final    NEGATIVE (NOTE)  Testing performed using the BD Probetec ET Chlamydia trachomatis and Neisseria gonorrhea amplified DNA assay.    Labs: CBC: Recent Labs  Lab 06/25/23 0435 06/28/23 2224 06/30/23 0444  WBC 9.0 10.5 9.8  NEUTROABS  --  6.2  --   HGB 8.8* 8.6* 8.8*  HCT 27.6* 27.6* 27.6*  MCV 86.8 86.5 85.2  PLT 230 233 251   Basic Metabolic Panel: Recent Labs  Lab 06/26/23 0413 06/27/23 0413 06/28/23 0437 06/29/23 0441 06/30/23 0444  NA 143 139 140 140 140  K 3.8 3.6 3.8 3.7 4.0  CL 117* 112* 112* 112* 113*  CO2 18* 18* 20* 18* 19*  GLUCOSE 100* 106* 107* 110* 106*  BUN 11 11 12 21  24*  CREATININE 2.49* 2.61* 2.77* 3.10* 3.11*  CALCIUM 8.8* 8.7* 8.9 8.6* 8.7*  PHOS  --   --  3.1  --  2.9   Liver Function Tests: Recent Labs  Lab 06/28/23 0437 06/30/23 0444  ALBUMIN 2.7* 2.6*   CBG: Recent Labs  Lab 06/28/23 1113 06/28/23 1623 06/28/23 2115 06/29/23 2256 06/30/23 0720  GLUCAP 116* 125* 134* 129* 96    Discharge time spent: approximately 35 minutes spent on discharge counseling, evaluation of patient on day of discharge, and coordination of discharge planning with nursing, social work, pharmacy and case management  Signed: Alberteen Sam, MD [facility] 06/30/2023

## 2023-06-30 NOTE — Progress Notes (Signed)
Reason for Consult:  AKI unclear etiology  Referring Physician: Dr. Maryfrances Bunnell, MD   Brittany Colon is an 62 y.o. female with PMHx significant for right sided BrCA s/p lumpectomy, mild DM well controlled, and HLD who presented with 1 month epigastric pain and nausea, and now AKI.   Subjective: Patient tolerated renal biopsy procedure with IR and recovery well yesterday with no significant bleeding. Patient is sitting upright in bedside chair. She does not have pain at the biopsy site. Patient admits that she does have mild swelling near her ankles which is baseline for her and she can see her sock line toward the end of the day. She continues with decreased appetite and states that food seems to sit in the epigastric region/feels like it takes a long time to digest. No other concerns today. She is eager to have the renal biopsy results and the rest of the lab results. She denies fevers, chills, chest pain, shortness of breath. Patient was informed that since she is stable, it is reasonable to discharge and follow up outpatient for results and further plan.   Trend in Creatinine: Creatinine  Date/Time Value Ref Range Status  12/22/2012 12:19 PM 0.8 0.6 - 1.1 mg/dL Final   Creatinine, Ser  Date/Time Value Ref Range Status  06/30/2023 04:44 AM 3.11 (H) 0.44 - 1.00 mg/dL Final  32/44/0102 72:53 AM 3.10 (H) 0.44 - 1.00 mg/dL Final  66/44/0347 42:59 AM 2.77 (H) 0.44 - 1.00 mg/dL Final  56/38/7564 33:29 AM 2.61 (H) 0.44 - 1.00 mg/dL Final  51/88/4166 06:30 AM 2.49 (H) 0.44 - 1.00 mg/dL Final  16/08/930 35:57 PM 2.52 (H) 0.44 - 1.00 mg/dL Final  32/20/2542 70:62 AM 2.35 (H) 0.44 - 1.00 mg/dL Final  37/62/8315 17:61 AM 2.27 (H) 0.44 - 1.00 mg/dL Final  60/73/7106 26:94 PM 2.38 (H) 0.44 - 1.00 mg/dL Final  85/46/2703 50:09 PM 0.70 0.44 - 1.00 mg/dL Final  38/18/2993 71:69 PM 0.68 0.44 - 1.00 mg/dL Final    PMH:   Past Medical History:  Diagnosis Date   Allergy    Breast cancer (HCC) 12/14/12    right   Breast cyst 12/18/12   mult bilaterally per MRI   Headache(784.0)    History of radiation therapy 02/09/2013-03/31/2013   62.4 gray to the right breast   Personal history of radiation therapy 2014   Right Breast Cancer    PSH:   Past Surgical History:  Procedure Laterality Date   BREAST BIOPSY Right 2014   BREAST LUMPECTOMY Right 2014   BREAST LUMPECTOMY WITH NEEDLE LOCALIZATION Right 01/10/2013   Procedure: BREAST LUMPECTOMY WITH NEEDLE LOCALIZATION;  Surgeon: Mariella Saa, MD;  Location: [facility];  Service: General;  Laterality: Right;   CARPAL TUNNEL RELEASE Right    FOOT SURGERY  1993   bone spur lt foot   KNEE SURGERY Bilateral     Allergies:  Allergies  Allergen Reactions   Elemental Sulfur     vomiting   Sulfa Antibiotics     Medications:   Prior to Admission medications   Medication Sig Start Date End Date Taking? Authorizing Provider  acetaminophen (TYLENOL) 500 MG tablet Take 1,000 mg by mouth every 8 (eight) hours as needed for mild pain (pain score 1-3) or moderate pain (pain score 4-6).   Yes [provider]  amoxicillin-clavulanate (AUGMENTIN) 875-125 MG tablet Take 1 tablet by mouth 2 (two) times daily. 06/08/23  Yes [provider]  calcium carbonate (SUPER CALCIUM) 1500 (600  Ca) MG TABS tablet Take 600 mg by mouth daily.   Yes [provider]  ibuprofen (ADVIL) 800 MG tablet Take 800 mg by mouth every 8 (eight) hours as needed for mild pain (pain score 1-3) or moderate pain (pain score 4-6).   Yes [provider]  Melaton-Thean-Cham-PassF-LBalm (MELATONIN + L-THEANINE PO) Take 1 capsule by mouth at bedtime as needed.   Yes [provider]  metFORMIN (GLUCOPHAGE-XR) 500 MG 24 hr tablet Take 500 mg by mouth See admin instructions. Take 1 tablet by mouth with evening meal every day.   Yes [provider]  Multiple Vitamins-Minerals (MULTIVITAMIN PO) Take by mouth daily.   Yes  [provider]  ondansetron (ZOFRAN) 8 MG tablet Take 1 tablet (8 mg total) by mouth every 8 (eight) hours as needed for nausea or vomiting. 07/07/18  Yes Rodriguez-Southworth, Nettie Elm, PA-C  pantoprazole (PROTONIX) 40 MG tablet Take 40 mg by mouth 2 (two) times daily. 06/15/23  Yes [provider]  rosuvastatin (CRESTOR) 20 MG tablet Take 1 tablet by mouth daily. 03/18/21  Yes [provider]  nystatin (MYCOSTATIN) 100000 UNIT/ML suspension Take 5 mLs by mouth 4 (four) times daily. 06/08/23   [provider]    Inpatient medications:  insulin aspart  0-5 Units Subcutaneous QHS   insulin aspart  0-9 Units Subcutaneous TID WC   pantoprazole  40 mg Oral QAC breakfast   rosuvastatin  20 mg Oral Daily    Discontinued Meds:   Medications Discontinued During This Encounter  Medication Reason   celecoxib (CELEBREX) 200 MG capsule Patient Preference   metroNIDAZOLE (FLAGYL) 500 MG tablet Patient Preference   Glucosamine HCl (GLUCOSAMINE PO) Patient Preference   Ferrous Sulfate (IRON PO) Patient Preference   cefUROXime (CEFTIN) 250 MG tablet Patient Preference   tamoxifen (NOLVADEX) 20 MG tablet Patient Preference   pantoprazole (PROTONIX) EC tablet 40 mg    heparin injection 5,000 Units    heparin injection 5,000 Units     Social History:  reports that she has never smoked. She does not have any smokeless tobacco history on file. She reports that she does not drink alcohol and does not use drugs.  Family History:   Family History  Problem Relation Age of Onset   Heart disease Mother     Pertinent items noted in HPI and remainder of comprehensive ROS otherwise negative. Weight change:   Intake/Output Summary (Last 24 hours) at 06/30/2023 0828 Last data filed at 06/30/2023 0109 Gross per 24 hour  Intake 240 ml  Output 0 ml  Net 240 ml   BP 126/76 (BP Location: Left Arm)   Pulse 92   Temp 98.4 F (36.9 C)   Resp 18   Ht 5\' 2"  (1.575 m)   Wt  77.1 kg   SpO2 99%   BMI 31.09 kg/m  Vitals:   06/29/23 1102 06/29/23 1752 06/29/23 2054 06/30/23 0620  BP: 124/76 116/89 118/69 126/76  Pulse: 81 92 88 92  Resp:  20 18 18   Temp: 98.5 F (36.9 C) 98.3 F (36.8 C) 98.4 F (36.9 C) 98.4 F (36.9 C)  TempSrc: Oral     SpO2: 99% 99% 100% 99%  Weight:      Height:         Physical Exam: General: Sitting upright in chair, NAD.  Head: No apparent abnormalities. CV: RRR. No murmur or rub. Lungs: CTAB to anterior lung fields. Room air with no increased work of breathing. Abd: Soft, non-tender  to palpation of four quadrants. Mild tenderness to palpation of epigastrium. No guarding or rebound. Extremities: Mild peripheral non-pitting edema noted to bilateral ankles.  Skin: No petichiae or purpura noted.  Psych: Mood and affect appropriate.   Labs: Basic Metabolic Panel: Recent Labs  Lab 06/23/23 1344 06/24/23 0704 06/25/23 0435 06/25/23 1907 06/26/23 0413 06/27/23 0413 06/28/23 0437 06/29/23 0441 06/30/23 0444  NA 142   < > 145 141 143 139 140 140 140  K 3.6   < > 3.9 3.7 3.8 3.6 3.8 3.7 4.0  CL 109   < > 117* 113* 117* 112* 112* 112* 113*  CO2 19*   < > 20* 19* 18* 18* 20* 18* 19*  GLUCOSE 80   < > 102* 119* 100* 106* 107* 110* 106*  BUN 21   < > 13 13 11 11 12 21  24*  CREATININE 2.38*   < > 2.35* 2.52* 2.49* 2.61* 2.77* 3.10* 3.11*  ALBUMIN 3.1*  --   --   --   --   --  2.7*  --  2.6*  CALCIUM 9.2   < > 8.8* 8.9 8.8* 8.7* 8.9 8.6* 8.7*  PHOS  --   --   --   --   --   --  3.1  --  2.9   < > = values in this interval not displayed.   Liver Function Tests: Recent Labs  Lab 06/23/23 1344 06/28/23 0437 06/30/23 0444  AST 19  --   --   ALT 15  --   --   ALKPHOS 74  --   --   BILITOT 0.1*  --   --   PROT 8.1  --   --   ALBUMIN 3.1* 2.7* 2.6*   Recent Labs  Lab 06/23/23 1344  LIPASE 38   No results for input(s): "AMMONIA" in the last 168 hours. CBC: Recent Labs  Lab 06/23/23 1344 06/25/23 0435  06/28/23 2224 06/30/23 0444  WBC 9.4 9.0 10.5 9.8  NEUTROABS  --   --  6.2  --   HGB 10.1* 8.8* 8.6* 8.8*  HCT 32.5* 27.6* 27.6* 27.6*  MCV 87.4 86.8 86.5 85.2  PLT 262 230 233 251   PT/INR: @LABRCNTIP (inr:5) Cardiac Enzymes: )No results for input(s): "CKTOTAL", "CKMB", "CKMBINDEX", "TROPONINI" in the last 168 hours. CBG: Recent Labs  Lab 06/28/23 1113 06/28/23 1623 06/28/23 2115 06/29/23 2256 06/30/23 0720  GLUCAP 116* 125* 134* 129* 96    Iron Studies:  Recent Labs  Lab 06/24/23 0704  IRON 25*  TIBC 217*  FERRITIN 204    Xrays/Other Studies: US BIOPSY (KIDNEY)  Result Date: 06/29/2023 INDICATION: 62 year old with subacute kidney injury. Request for random renal biopsy. EXAM: ULTRASOUND-GUIDED RANDOM RENAL BIOPSY MEDICATIONS: Moderate sedation ANESTHESIA/SEDATION: Moderate (conscious) sedation was employed during this procedure. A total of Versed 1.5 mg and Fentanyl 75 mcg was administered intravenously by the radiology nurse. Total intra-service moderate Sedation Time: 22 minutes. The patient's level of consciousness and vital signs were monitored continuously by radiology nursing throughout the procedure under my direct supervision. FLUOROSCOPY TIME:  None COMPLICATIONS: None immediate. PROCEDURE: Informed written consent was obtained from the patient after a thorough discussion of the procedural risks, benefits and alternatives. All questions were addressed. A timeout was performed prior to the initiation of the procedure. Patient was placed prone. Both kidneys were evaluated with ultrasound. Left kidney was selected for biopsy. Left flank was prepped and draped in sterile fashion. Maximal barrier sterile technique was utilized  including caps, mask, sterile gowns, sterile gloves, sterile drape, hand hygiene and skin antiseptic. Skin was anesthetized using 1% lidocaine. Small incision was made. Using ultrasound guidance, a 17 gauge coaxial needle was directed to the lower pole  cortex. Two core biopsies were obtained from the lower pole cortex with an 18 gauge core device. Specimens placed in saline. Gel-Foam slurry was injected as the 17 gauge coaxial needle was retracted. Bandage placed over the puncture site. FINDINGS: 2 adequate core biopsy specimens were obtained. No immediate bleeding or hematoma formation. IMPRESSION: Successful ultrasound-guided left renal biopsy. Electronically Signed   By: Richarda Overlie M.D.   On: 06/29/2023 11:44    Assessment/Plan:   Subacute non-oliguric kidney injury of unclear etiology  Initial UA with no hematuria or pyuria, UPC of less than 1.  No clear nephrotoxin exposures.  CT abdomen/pelvis with negative imaging for obstruction, mass, stones. Kidney function was continuing to slowly decline while in hospital. Renal labs today similar compared to day prior and stable. Crt today of 3.11 from 3.10 yesterday. See trend above. Definitive kidney biopsy completed 11/04 with IR and results are in process. Biopsy results will hopefully guide further treatment. Repeat UA with more evidence suggestive of nephritic syndrome with: small Hbg, 30 protein, trace leukocyte esterase, 0-5 RBC, and 6-10 WBC.  SPEP and SFLC results not released. Query IgG4 disease, value pending (per GI team). Additional autoimmune labs ordered 11/04 including: ANA, ANCA, GBM, C3, C4, ds-DNA, ASO titer, ordered due to new evidence of hematuria and protein on repeat UA. ANA negative. C3 and C4 mildly elevated and IgG is elevated at 2,116. IgA and IgM WNL. Other labs not resulted.  Patient making urine -420 mL yesterday. Patient encouraged to eat and drink as tolerated. Patient with stable renal labs and feeling overall okay. Does not appear ill. From a Nephrology perspective, patient can be discharged and follow up in outpatient setting. Patient informed that we will call with renal biopsy results. IgG4 still pending. It would be reasonable to start patient on 20-40 mg PO prednisone  and then taper while waiting for lab results to process. Patient was educated that prednisone can elevate blood sugar and blood pressure. She is taking Pantoprazole 40 mg daily while may provide protection of stomach lining with steroid treatment. Poor oral intake with nausea EGD largely reassuring. GI workup reassuring, MRCP negative.   Anemia Hbg downtrending during hospitalization, but stable. Most recent Hbg of 8.8. No gross bleeding from renal biopsy site.  4.   NAG metabolic acidosis 5.   History of breast cancer 6.   Medication Issues; 7.  Preferred narcotic agents for pain control are hydromorphone, fentanyl, and methadone. Morphine should not be used.  8.  Baclofen should be avoided 9.  Avoid oral sodium phosphate and magnesium citrate based laxatives / bowel preps    Theador Hawthorne Shaelynn Dragos PA student  06/30/2023, 8:28 AM

## 2023-07-01 ENCOUNTER — Encounter: Payer: Self-pay | Admitting: Internal Medicine

## 2023-07-01 ENCOUNTER — Encounter (HOSPITAL_COMMUNITY): Payer: Self-pay | Admitting: Internal Medicine

## 2023-07-01 LAB — ANCA TITERS
Atypical P-ANCA titer: <1:20 {titer}
C-ANCA: <1:20 {titer}
P-ANCA: <1:20 {titer}

## 2023-07-01 LAB — COMPLEMENT, TOTAL: Compl, Total (CH50): >60 U/mL (ref >41)

## 2023-07-01 LAB — SURGICAL PATHOLOGY

## 2023-07-01 LAB — IGG 4: IgG, Subclass 4: 31 mg/dL (ref 2–96)

## 2023-07-09 ENCOUNTER — Encounter (HOSPITAL_COMMUNITY): Payer: Self-pay

## 2023-07-09 LAB — SURGICAL PATHOLOGY

## 2023-07-31 ENCOUNTER — Encounter: Payer: Self-pay | Admitting: Gastroenterology

## 2023-07-31 ENCOUNTER — Ambulatory Visit (INDEPENDENT_AMBULATORY_CARE_PROVIDER_SITE_OTHER): Payer: Medicare Other | Admitting: Gastroenterology

## 2023-07-31 VITALS — BP 140/78 | HR 98 | Ht 63.0 in | Wt 180.0 lb

## 2023-07-31 DIAGNOSIS — R1013 Epigastric pain: Secondary | ICD-10-CM

## 2023-07-31 DIAGNOSIS — R634 Abnormal weight loss: Secondary | ICD-10-CM | POA: Diagnosis not present

## 2023-07-31 NOTE — Progress Notes (Signed)
07/31/2023 Brittany Colon [id number] 1961-02-27   HISTORY OF PRESENT ILLNESS: This is a 62 year old female with past medical history of breast cancer.  She was seen by our service as an inpatient at the end of October for complaints of epigastric abdominal pain.  Contrasted CT scan showed question edema within the pancreas and possible subtle haziness of the peripancreatic fat.  Lipase and LFTs are normal.  EGD 06/2023:  - Submucosal nodule found in the lower esophagus ( just proximal to GE junction) . This is not felt ot explain current symptoms. Query esophageal duplication cyst. - Normal stomach. Biopsied. - Normal examined duodenum.  A. STOMACH, BODY AND ANTRUM, BIOPSY:  Gastric antral and oxyntic mucosa with mild chronic gastritis.  Immunohistochemistry for Helicobacter pylori is negative.   Follow-up MRI of the abdomen/MRCP was essentially normal.  Is here for follow-up.  She continues to complain of epigastric abdominal pain.  She says that it is constant, ranging from 6-8 to 10 on the pain scale.  She denies any associated nausea, vomiting, fever, change in her bowel habits, black or bloody stool.  She is eating fairly normally.  Pain is not keeping her from sleeping.  She reports having a colonoscopy here in [place] with Dr. Loreta Ave or Dr. Elnoria Howard.  She reports that this was done pre-COVID and was normal.   Past Medical History:  Diagnosis Date   Allergy    Breast cancer (HCC) 12/14/12   right   Breast cyst 12/18/12   mult bilaterally per MRI   Headache(784.0)    History of radiation therapy 02/09/2013-03/31/2013   62.4 gray to the right breast   Personal history of radiation therapy 2014   Right Breast Cancer   Past Surgical History:  Procedure Laterality Date   BIOPSY  06/26/2023   Procedure: BIOPSY;  Surgeon: Beverley Fiedler, MD;  Location[facility];  Service: Gastroenterology;;   BREAST BIOPSY Right 2014   BREAST LUMPECTOMY Right 2014   BREAST LUMPECTOMY  WITH NEEDLE LOCALIZATION Right 01/10/2013   Procedure: BREAST LUMPECTOMY WITH NEEDLE LOCALIZATION;  Surgeon: Mariella Saa, MD;  Location: [facility];  Service: General;  Laterality: Right;   CARPAL TUNNEL RELEASE Right    ESOPHAGOGASTRODUODENOSCOPY (EGD) WITH PROPOFOL N/A 06/26/2023   Procedure: ESOPHAGOGASTRODUODENOSCOPY (EGD) WITH PROPOFOL;  Surgeon: Beverley Fiedler, MD;  Location: [facility] ENDOSCOPY;  Service: Gastroenterology;  Laterality: N/A;   FOOT SURGERY  1993   bone spur lt foot   KNEE SURGERY Bilateral     reports that she has never smoked. She does not have any smokeless tobacco history on file. She reports that she does not drink alcohol and does not use drugs. family history includes Heart disease in her mother. Allergies  Allergen Reactions   Augmentin [Amoxicillin-Pot Clavulanate] Nausea And Vomiting   Elemental Sulfur     vomiting   Sulfa Antibiotics       Outpatient Encounter Medications as of 07/31/2023  Medication Sig   calcium carbonate (SUPER CALCIUM) 1500 (600 Ca) MG TABS tablet Take 600 mg by mouth daily.   Multiple Vitamins-Minerals (MULTIVITAMIN PO) Take by mouth daily.   ondansetron (ZOFRAN) 8 MG tablet Take 1 tablet (8 mg total) by mouth every 8 (eight) hours as needed for nausea or vomiting.   pantoprazole (PROTONIX) 40 MG tablet Take 40 mg by mouth 2 (two) times daily.   rosuvastatin (CRESTOR) 20 MG tablet Take 1 tablet by mouth daily.   [DISCONTINUED] acetaminophen (TYLENOL) 500 MG  tablet Take 1,000 mg by mouth every 8 (eight) hours as needed for mild pain (pain score 1-3) or moderate pain (pain score 4-6).   [DISCONTINUED] Melaton-Thean-Cham-PassF-LBalm (MELATONIN + L-THEANINE PO) Take 1 capsule by mouth at bedtime as needed.   [DISCONTINUED] nystatin (MYCOSTATIN) 100000 UNIT/ML suspension Take 5 mLs by mouth 4 (four) times daily.   No facility-administered encounter medications on file as of 07/31/2023.    REVIEW OF SYSTEMS  : All other  systems reviewed and negative except where noted in the History of Present Illness.   PHYSICAL EXAM: BP (!) 140/78   Pulse 98   Ht 5\' 3"  (1.6 m)   Wt 180 lb (81.6 kg)   BMI 31.89 kg/m  General: Well developed female in no acute distress Head: Normocephalic and atraumatic Eyes:  Sclerae anicteric, conjunctiva pink. Ears: Normal auditory acuity Lungs: Clear throughout to auscultation; no W/R/R. Heart: Regular rate and rhythm; no M/R/G. Abdomen: Soft, non-distended.  BS present.  Mild epigastric TTP. Musculoskeletal: Symmetrical with no gross deformities  Skin: No lesions on visible extremities Extremities: No edema  Neurological: Alert oriented x 4, grossly non-focal Psychological:  Alert and cooperative. Normal mood and affect  ASSESSMENT AND PLAN: *62 year old female seen by our service as inpatient for complaints of epigastric abdominal pain with weight loss and a noncontrasted CT scan raising possibility of haziness in the pancreatic fat and perhaps mild pancreatic edema.  Lipase and LFTs normal.  Follow-up MRI really unremarkable.  EGD unremarkable.  She is here for follow-up.  Continues to complain of 6-8 out of 10 epigastric abdominal pain.  No other associated symptoms.  I am going to have to talk to Dr. Rhea Belton and see what his thoughts are on any other evaluation.  Question if she may need EUS.  For now I recommended Tylenol, heating pad, topical patches, etc.  **She did have colonoscopy it sounds like by Dr. Loreta Ave or Dr. Elnoria Howard pre-COVID so we will try to request those results.  She reports that colonoscopy was normal.   CC:  Georgianne Fick, MD

## 2023-07-31 NOTE — Patient Instructions (Signed)
Brittany Colon will get back to you after she talks with Dr. Rhea Belton.  _______________________________________________________  If your blood pressure at your visit was 140/90 or greater, please contact your primary care physician to follow up on this.  _______________________________________________________  If you are age 61 or older, your body mass index should be between 23-30. Your Body mass index is 31.89 kg/m. If this is out of the aforementioned range listed, please consider follow up with your Primary Care Provider.  If you are age 48 or younger, your body mass index should be between 19-25. Your Body mass index is 31.89 kg/m. If this is out of the aformentioned range listed, please consider follow up with your Primary Care Provider.   ________________________________________________________  The [facility] providers would like to encourage you to use [facility] to communicate with providers for non-urgent requests or questions.  Due to long hold times on the telephone, sending your provider a message by [facility] may be a faster and more efficient way to get a response.  Please allow 48 business hours for a response.  Please remember that this is for non-urgent requests.  _______________________________________________________

## 2023-08-05 ENCOUNTER — Telehealth: Payer: Self-pay | Admitting: *Deleted

## 2023-08-05 MED ORDER — AMITRIPTYLINE HCL 25 MG PO TABS: 25.0000 mg | ORAL_TABLET | Freq: Every day | ORAL | 1 refills | Status: AC

## 2023-08-05 NOTE — Telephone Encounter (Signed)
-----   Message from Wingate D. Zehr sent at 08/04/2023  4:51 PM EST ----- Please let the patient know that I reviewed her imaging and all her information with Dr. Rhea Belton and also our pancreatic specialist, Dr. Meridee Score.  They do not think that she needs any other evaluation of her pancreas, etc. at this point since MRI looked good.  Dr. Rhea Belton would like [place] to start her on amitriptyline 25 mg at bedtime.  This is used for different kinds of nerve pain, abdominal pain, and other GI symptoms.  Overall is very well-tolerated, but can cause sleepiness which is why we recommend taking it at bedtime.  Then please schedule her for follow-up to come back either with myself or Dr. Rhea Belton in 8 to 12 weeks. ----- Message ----- From: Beverley Fiedler, MD Sent: 08/03/2023  10:08 AM EST To: Leta Baptist, PA-C; #  Lauro Franklin Would try amitrip 25 mg at bedtime and then see her back in 8-12 weeks JMP ----- Message ----- From: Lemar Lofty., MD Sent: 08/01/2023   6:05 AM EST To: Beverley Fiedler, MD; Leta Baptist, PA-C  Unless she is having elevations in her lipase/amylase with these episodes of pain, I think her MRI is enough. If she has persistently elevated enzymes, EUS is not unreasonable. GM ----- Message ----- From: Leta Baptist, PA-C Sent: 07/31/2023   3:04 PM EST To: Beverley Fiedler, MD; Lemar Lofty., MD  Definitely could try something like that.  I just did not want to overlook something because of those initial findings so just wanted to check on the EUS thing. ----- Message ----- From: Beverley Fiedler, MD Sent: 07/31/2023   2:44 PM EST To: Princella Pellegrini Zehr, PA-C; #  Would consider her TCA?  Amitriptyline trial? I have copied Gabe because of her original abnormal pancreatic haziness by CT but the MRI did not support Gabe you see any role for EUS? ----- Message ----- From: Leta Baptist, PA-C Sent: 07/31/2023   2:41 PM EST To: Beverley Fiedler, MD  Hello! I will send you this lady's  note when I finish it later today, but I am not really sure what else to do with her.  She continues to complain of epigastric abdominal pain anywhere from a 6-8 out of 10.  No nausea, vomiting, or any other associated symptoms.  She is eating fairly normally.  Looks very comfortable at her visit today.  Very minimally tender on exam.  She ended up having the MRI that you can see was unremarkable.  All of her other evaluation was unremarkable.  I am not really sure where else to go with her.  EUS?  Thank you for your help!  Jess

## 2023-08-05 NOTE — Telephone Encounter (Signed)
Patient informed of information below. Patient voiced understanding. She is schedule with Dr. Rhea Belton on 09/29/23. Script sent to pharmacy for Amitriptyline.

## 2023-08-06 ENCOUNTER — Telehealth: Payer: Self-pay | Admitting: *Deleted

## 2023-08-06 NOTE — Telephone Encounter (Signed)
Spoke with patient and asked her to stop by to sign a records release.

## 2023-08-06 NOTE — Telephone Encounter (Signed)
Patient called requested a call back to go over the instructions she was given yesterday.

## 2023-08-06 NOTE — Telephone Encounter (Signed)
-----   Message from Scott D. Zehr sent at 07/31/2023  4:40 PM EST ----- I forgot to tell [name] to have her sign for her colonoscopy records from Dr. Mann/Dr. Elnoria Howard.  Can you contact her to see if she could come sign a release of records?  She said that this was done pre-COVID.

## 2023-08-06 NOTE — Telephone Encounter (Signed)
Spoke with patient and discuss the reason for the Amitriptyline.

## 2023-08-14 NOTE — Progress Notes (Signed)
Addendum: Reviewed and agree with assessment and management plan. Kadijah Shamoon M, MD  

## 2023-09-29 ENCOUNTER — Other Ambulatory Visit: Payer: Self-pay

## 2023-09-29 ENCOUNTER — Telehealth: Payer: Self-pay | Admitting: Gastroenterology

## 2023-09-29 ENCOUNTER — Telehealth: Payer: Self-pay

## 2023-09-29 ENCOUNTER — Encounter: Payer: Self-pay | Admitting: Internal Medicine

## 2023-09-29 ENCOUNTER — Ambulatory Visit: Payer: Medicare PPO | Admitting: Internal Medicine

## 2023-09-29 VITALS — BP 140/88 | HR 92 | Ht 63.0 in | Wt 188.0 lb

## 2023-09-29 DIAGNOSIS — K2289 Other specified disease of esophagus: Secondary | ICD-10-CM

## 2023-09-29 DIAGNOSIS — K219 Gastro-esophageal reflux disease without esophagitis: Secondary | ICD-10-CM | POA: Diagnosis not present

## 2023-09-29 DIAGNOSIS — R1013 Epigastric pain: Secondary | ICD-10-CM

## 2023-09-29 NOTE — Telephone Encounter (Signed)
EUS has been set up for 10/05/23 at [facility] with GM   EUS scheduled, pt instructed and medications reviewed.  Patient instructions mailed to home.  Patient to call with any questions or concerns.

## 2023-09-29 NOTE — Patient Instructions (Addendum)
 Try to reduce pantoprazole  40 mg daily.   We will obtain your previous records from Dr. Rollin.   We will contact you regarding the procedure.   _______________________________________________________  If your blood pressure at your visit was 140/90 or greater, please contact your primary care physician to follow up on this.  _______________________________________________________  If you are age 62 or older, your body mass index should be between 23-30. Your Body mass index is 33.3 kg/m. If this is out of the aforementioned range listed, please consider follow up with your Primary Care Provider.  If you are age 40 or younger, your body mass index should be between 19-25. Your Body mass index is 33.3 kg/m. If this is out of the aformentioned range listed, please consider follow up with your Primary Care Provider.   ________________________________________________________  The [facility] providers would like to encourage you to use MYCHART to communicate with providers for non-urgent requests or questions.  Due to long hold times on the telephone, sending your provider a message by [facility] may be a faster and more efficient way to get a response.  Please allow 48 business hours for a response.  Please remember that this is for non-urgent requests.  _______________________________________________________

## 2023-09-29 NOTE — Progress Notes (Signed)
 Subjective:    Patient ID: Brittany Colon, female    DOB: 1960/09/25, 63 y.o.   MRN: [id number]  HPI Brittany Colon is a 63 year old female with a history of hyperlipidemia, diabetes type 2, DCIS of the right breast who is seen for ongoing epigastric abdominal pain.  She is here alone today.  She was last seen on 07/31/2023 by Jessica Zehr, PA-C.  She has had ongoing epigastric abdominal pain since being hospitalized with epigastric pain and AKI in late October.  At that time CT imaging of the abdomen suggested haziness around the pancreas though lipase was normal.  Upper endoscopy was performed during that hospitalization.  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  Gastric biopsies were unremarkable.  She has had ongoing pain and so is here for follow-up.  Follow-up MRI imaging was normal of the pancreas and upper abdomen.  She experiences persistent epigastric pain that is intermittent, with a particularly severe episode occurring on Wednesday and Thursday of last week. The pain is described as achy, located in the upper abdomen, and radiates to her back on both sides. There is no definite trigger, and no specific foods have been identified that exacerbate it.  No associated nausea, diarrhea, or blood in stool. Bowel movements are regular, and there is no weight loss. She reports a sensation of food moving when swallowing, which is different from her usual experience.  Her past medical workup includes a colonoscopy performed before the COVID-19 pandemic, with no recollection of polyps being found.   Current medications include Crestor , pantoprazole , and amlodipine, along with calcium  and a vitamin supplement. She takes pantoprazole  twice a day.  She tried amitriptyline  25 mg for over a month without improvement of the pain.  She has thus stopped this medicine.  Review of Systems As per HPI, otherwise negative  Current Medications,  Allergies, Past Medical History, Past Surgical History, Family History and Social History were reviewed in [employer] record.    Objective:   Physical Exam BP (!) 140/88   Pulse 92   Ht 5' 3 (1.6 m)   Wt 188 lb (85.3 kg)   BMI 33.30 kg/m  Gen: awake, alert, NAD HEENT: anicteric  CV: RRR, no mrg Pulm: CTA b/l Abd: soft, epigastric tenderness without rebound or guarding, no masses or distention, +BS throughout Ext: no c/c/e Neuro: nonfocal  MRI ABDOMEN WITHOUT AND WITH CONTRAST (INCLUDING MRCP)   TECHNIQUE: Multiplanar multisequence MR imaging of the abdomen was performed both before and after the administration of intravenous contrast. Heavily T2-weighted images of the biliary and pancreatic ducts were obtained, and three-dimensional MRCP images were rendered by post processing.   CONTRAST:  7mL GADAVIST  GADOBUTROL  1 MMOL/ML IV SOLN   COMPARISON:  None Available.   FINDINGS: Lower chest: No acute findings.   Hepatobiliary: No hepatic masses identified. Gallbladder is unremarkable. No evidence of biliary ductal dilatation, with common bile duct measuring 5 mm in diameter. No evidence of choledocholithiasis.   Pancreas: No evidence of pancreatic mass. No definite evidence of pancreatic edema or peripancreatic inflammatory changes. No evidence of pancreatic ductal dilatation or pancreas divisum.   Spleen:  Within normal limits in size and appearance.   Adrenals/Urinary Tract: No suspicious masses identified. No evidence of hydronephrosis.   Stomach/Bowel: Unremarkable.   Vascular/Lymphatic: No pathologically enlarged lymph nodes identified. No acute vascular findings.   Other:  None.   Musculoskeletal:  No suspicious bone lesions identified.  IMPRESSION: No radiographic evidence of pancreatitis or other acute findings.   No evidence of biliary ductal dilatation or choledocholithiasis.     Electronically Signed   By: Norleen DELENA Kil  M.D.   On: 06/27/2023 15:47      Assessment & Plan:  63 year old female with a history of hyperlipidemia, diabetes type 2, DCIS of the right breast who is seen for ongoing epigastric abdominal pain.   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  No other alarm symptoms.  Reassuring MRI imaging. Persistent, intermittent pain with no identified trigger. Pain radiates to the back on both sides. No associated nausea, diarrhea, or blood in stool. Previous upper endoscopy revealed a cyst in the lower part of the esophagus. -Plan for an internal ultrasound (EUS) with Dr. Wilhelmenia to further evaluate the esophageal cyst and surrounding area. -Consider other medications for pain management if the cyst is not the cause of the pain (duloxetine?).  2. Gastroesophageal Reflux Disease (GERD) possible with ongoing epigastric pain Currently on Pantoprazole  twice daily. -Reduce Pantoprazole  to once daily and monitor for any increase in symptoms. If tolerated, the prescription will be changed accordingly.  3.  Colonoscopy Follow-up Last colonoscopy performed by Dr. Rollin before the COVID-19 pandemic. No polyps reported. -Obtain colonoscopy records from Dr. Rollin for completeness of GI health assessment and to determine next screening/surveillance interval  30 minutes total spent today including patient facing time, coordination of care, reviewing medical history/procedures/pertinent radiology studies, and documentation of the encounter.

## 2023-09-29 NOTE — Telephone Encounter (Signed)
Spoke with the pt and made her aware that there are no sooner appts available. She states she will keep the appt as planned.

## 2023-09-29 NOTE — Telephone Encounter (Signed)
 Patient called and stated that she had an appointment at the hospital for 10/05/2023 at 9:00. Patient wants to know if she can get a later time for that day. Patient stated that he daughter has to take her grandchild to school around that time. Patient is requesting a call back. Please Advise.

## 2023-09-29 NOTE — Telephone Encounter (Signed)
-----   Message from [facility] sent at 09/29/2023  1:41 PM EST ----- Regarding: RE: Ok.  Tevis Dunavan, Upper EUS with patient as available. Lots of spots on Monday if she is interested. Otherwise schedule as available for patient. GM ----- Message ----- From: Albertus Gordy HERO, MD Sent: 09/29/2023  10:06 AM EST To: Aloha Wilhelmenia Raddle., MD  Gabe, I would like to request an EUS for this patient with persistent epigastric pain.  I would like to characterize the cystic lesion in the distal esophagus.  She is having persistent pain over the last 3 to 4 months and the cyst was the only possible abnormality seen on her EGD.  Straight shooter, no alarm symptoms.  Haziness suggested on CT when the pain started in November near the pancreas but not seen on MRI.  Patient is okay with EUS if you are in agreement.  Thanks for your help. Gordy

## 2023-10-02 ENCOUNTER — Encounter (HOSPITAL_COMMUNITY): Payer: Self-pay | Admitting: Gastroenterology

## 2023-10-05 ENCOUNTER — Ambulatory Visit (HOSPITAL_COMMUNITY)
Admission: RE | Admit: 2023-10-05 | Discharge: 2023-10-05 | Disposition: A | Discharge status: Home / Self Care | Payer: Medicare PPO | Attending: Gastroenterology | Admitting: Gastroenterology

## 2023-10-05 ENCOUNTER — Other Ambulatory Visit: Payer: Self-pay

## 2023-10-05 ENCOUNTER — Ambulatory Visit (HOSPITAL_COMMUNITY): Payer: Medicare PPO | Admitting: Anesthesiology

## 2023-10-05 ENCOUNTER — Ambulatory Visit (HOSPITAL_BASED_OUTPATIENT_CLINIC_OR_DEPARTMENT_OTHER): Payer: Medicare PPO | Admitting: Anesthesiology

## 2023-10-05 ENCOUNTER — Encounter (HOSPITAL_COMMUNITY): Payer: Self-pay | Admitting: Gastroenterology

## 2023-10-05 ENCOUNTER — Encounter (HOSPITAL_COMMUNITY)
Admission: RE | Disposition: A | Discharge status: Home / Self Care | Payer: Self-pay | Source: Home / Self Care | Attending: Gastroenterology

## 2023-10-05 DIAGNOSIS — K869 Disease of pancreas, unspecified: Secondary | ICD-10-CM | POA: Diagnosis not present

## 2023-10-05 DIAGNOSIS — K2281 Esophageal polyp: Secondary | ICD-10-CM | POA: Insufficient documentation

## 2023-10-05 DIAGNOSIS — K838 Other specified diseases of biliary tract: Secondary | ICD-10-CM | POA: Diagnosis not present

## 2023-10-05 DIAGNOSIS — E119 Type 2 diabetes mellitus without complications: Secondary | ICD-10-CM | POA: Diagnosis not present

## 2023-10-05 DIAGNOSIS — K2289 Other specified disease of esophagus: Secondary | ICD-10-CM

## 2023-10-05 DIAGNOSIS — I899 Noninfective disorder of lymphatic vessels and lymph nodes, unspecified: Secondary | ICD-10-CM | POA: Insufficient documentation

## 2023-10-05 DIAGNOSIS — R1013 Epigastric pain: Secondary | ICD-10-CM

## 2023-10-05 DIAGNOSIS — R935 Abnormal findings on diagnostic imaging of other abdominal regions, including retroperitoneum: Secondary | ICD-10-CM

## 2023-10-05 HISTORY — PX: ESOPHAGOGASTRODUODENOSCOPY (EGD) WITH PROPOFOL: SHX5813

## 2023-10-05 HISTORY — PX: EUS: SHX5427

## 2023-10-05 SURGERY — UPPER ENDOSCOPIC ULTRASOUND (EUS) RADIAL
Anesthesia: Monitor Anesthesia Care

## 2023-10-05 MED ORDER — GLYCOPYRROLATE 0.2 MG/ML IJ SOLN
INTRAMUSCULAR | Status: DC | PRN
  Administered 2023-10-05 (×2): .1 mg via INTRAVENOUS

## 2023-10-05 MED ORDER — LIDOCAINE HCL (CARDIAC) PF 100 MG/5ML IV SOSY
PREFILLED_SYRINGE | INTRAVENOUS | Status: DC | PRN
  Administered 2023-10-05: 80 mg via INTRAVENOUS

## 2023-10-05 MED ORDER — PROPOFOL 500 MG/50ML IV EMUL
INTRAVENOUS | Status: DC | PRN
  Administered 2023-10-05: 40 mg via INTRAVENOUS
  Administered 2023-10-05: 30 mg via INTRAVENOUS
  Administered 2023-10-05: 40 mg via INTRAVENOUS
  Administered 2023-10-05: 80 ug/kg/min via INTRAVENOUS
  Administered 2023-10-05: 30 mg via INTRAVENOUS
  Administered 2023-10-05: 60 mg via INTRAVENOUS
  Administered 2023-10-05: 40 mg via INTRAVENOUS

## 2023-10-05 MED ORDER — CIPROFLOXACIN IN D5W 400 MG/200ML IV SOLN
INTRAVENOUS | Status: AC
  Filled 2023-10-05: qty 200

## 2023-10-05 MED ORDER — PROPOFOL 1000 MG/100ML IV EMUL
INTRAVENOUS | Status: AC
  Filled 2023-10-05: qty 300

## 2023-10-05 MED ORDER — SODIUM CHLORIDE 0.9 % IV SOLN: INTRAVENOUS | Status: DC

## 2023-10-05 MED ORDER — PROPOFOL 1000 MG/100ML IV EMUL
INTRAVENOUS | Status: AC
  Filled 2023-10-05: qty 100

## 2023-10-05 SURGICAL SUPPLY — 14 items

## 2023-10-05 NOTE — Discharge Instructions (Signed)

## 2023-10-05 NOTE — Transfer of Care (Signed)
 Immediate Anesthesia Transfer of Care Note  Patient: Brittany Colon  Procedure(s) Performed: UPPER ENDOSCOPIC ULTRASOUND (EUS) RADIAL ESOPHAGOGASTRODUODENOSCOPY (EGD) WITH PROPOFOL   Patient Location: PACU and Endoscopy Unit  Anesthesia Type:MAC  Level of Consciousness: drowsy  Airway & Oxygen Therapy: Patient Spontanous Breathing  Post-op Assessment: Report given to RN and Post -op Vital signs reviewed and stable  Post vital signs: Reviewed and stable  Last Vitals:  Vitals Value Taken Time  BP 117/67 10/05/23 0933  Temp 36.2 C 10/05/23 0933  Pulse 99 10/05/23 0936  Resp 19 10/05/23 0936  SpO2 97 % 10/05/23 0936  Vitals shown include unfiled device data.  Last Pain:  Vitals:   10/05/23 0933  TempSrc: Temporal  PainSc: Asleep         Complications: No notable events documented.

## 2023-10-05 NOTE — Anesthesia Preprocedure Evaluation (Signed)
 Anesthesia Evaluation  Patient identified by MRN, date of birth, ID band Patient awake    Reviewed: Allergy & Precautions, H&P , NPO status , Patient's Chart, lab work & pertinent test results  Airway Mallampati: II   Neck ROM: full    Dental   Pulmonary neg pulmonary ROS   breath sounds clear to auscultation       Cardiovascular negative cardio ROS  Rhythm:regular Rate:Normal     Neuro/Psych  Headaches    GI/Hepatic   Endo/Other  diabetes, Type 2    Renal/GU      Musculoskeletal   Abdominal   Peds  Hematology   Anesthesia Other Findings   Reproductive/Obstetrics H/o breast CA                             Anesthesia Physical Anesthesia Plan  ASA: 2  Anesthesia Plan: MAC   Post-op Pain Management:    Induction: Intravenous  PONV Risk Score and Plan: 2 and Propofol  infusion and Treatment may vary due to age or medical condition  Airway Management Planned: Nasal Cannula  Additional Equipment:   Intra-op Plan:   Post-operative Plan:   Informed Consent: I have reviewed the patients History and Physical, chart, labs and discussed the procedure including the risks, benefits and alternatives for the proposed anesthesia with the patient or authorized representative who has indicated his/her understanding and acceptance.     Dental advisory given  Plan Discussed with: CRNA, Anesthesiologist and Surgeon  Anesthesia Plan Comments:        Anesthesia Quick Evaluation

## 2023-10-05 NOTE — Anesthesia Procedure Notes (Signed)
 Procedure Name: MAC Date/Time: 10/05/2023 8:36 AM  Performed by: Norvell Beers, CRNAPre-anesthesia Checklist: Patient identified, Emergency Drugs available, Suction available and Patient being monitored Patient Re-evaluated:Patient Re-evaluated prior to induction Oxygen Delivery Method: Simple face mask Placement Confirmation: positive ETCO2

## 2023-10-05 NOTE — Op Note (Signed)
 [facility] Patient Name: Brittany Colon Procedure Date: 10/05/2023 MRN: [id number] Attending MD: Yong Henle , MD, [id number] Date of Birth: Nov 04, 1960 CSN: [id number] Age: 63 Admit Type: Outpatient Procedure:                Upper EUS Indications:              Esophageal mucosal mass/polyp found on endoscopy,                            Epigastric abdominal distress Providers:                Yong Henle, MD, Bradley Caffey, Rinda Cheers, Technician, Arlin Benes, Technician Referring MD:             Amber Bail. Pyrtle, MD Medicines:                Monitored Anesthesia Care Complications:            No immediate complications. Estimated Blood Loss:     Estimated blood loss was minimal. Procedure:                Pre-Anesthesia Assessment:                           - Prior to the procedure, a History and Physical                            was performed, and patient medications and                            allergies were reviewed. The patient's tolerance of                            previous anesthesia was also reviewed. The risks                            and benefits of the procedure and the sedation                            options and risks were discussed with the patient.                            All questions were answered, and informed consent                            was obtained. Prior Anticoagulants: The patient has                            taken no anticoagulant or antiplatelet agents. ASA                            Grade Assessment: II - A patient with mild systemic  disease. After reviewing the risks and benefits,                            the patient was deemed in satisfactory condition to                            undergo the procedure.                           After obtaining informed consent, the endoscope was                            passed under direct vision. Throughout  the                            procedure, the patient's blood pressure, pulse, and                            oxygen saturations were monitored continuously. The                            [id number]-H190 ([id number]) Olympus endoscope was introduced                            through the mouth, and advanced to the second part                            of duodenum. The [id number]-[id number] ([id number]) Olympus                            duodenoscope was introduced through the mouth, and                            advanced to the area of papilla. The GF-[id number]-AL5                            ([id number]) Olympus radial ultrasound scope was                            introduced through the mouth, and advanced to the                            stomach for ultrasound examination from the                            esophagus and stomach. The [id number] ([id number])                            Olympus linear ultrasound scope was introduced                            through the mouth, and advanced to the duodenum for  ultrasound examination from the esophagus, stomach                            and duodenum. The upper EUS was accomplished                            without difficulty. The patient tolerated the                            procedure. Scope In: Scope Out: Findings:      ENDOSCOPIC FINDING: :      No gross lesions were noted in the proximal esophagus and in the mid       esophagus.      A single 12 mm submucosal nodule was found in the distal esophagus, 36       to 37 cm from the incisors moving towards the GE junction.      The Z-line was irregular and was found 37 cm from the incisors.      No gross lesions were noted in the entire examined stomach.      No gross lesions were noted in the duodenal bulb, in the first portion       of the duodenum and in the second portion of the duodenum.      The major papilla was flat but otherwise normal and hidden under a hood.      ENDOSONOGRAPHIC  FINDING: :      A round intramural (subepithelial) cystic lesion was found in the lower       third of the esophagus. It was encountered at 36 cm from the incisors       and extended to 37 cm. The lesion was anechoic. Sonographically, the       origin appeared to be within the deep mucosa (Layer 2). The cyst       measured 10 mm by 5.5 mm in maximal cross-sectional diameter. The       endosonographic borders were well-defined.      Otherwise the rest of the endosonographic imaging in the upper third of       the esophagus, in the middle third of the esophagus and in the lower       third of the esophagus showed no intramural (subepithelial) lesion, mass       or wall thickening.      Pancreatic parenchymal abnormalities were noted in the pancreatic head       (PD = 2.8 mm), genu of the pancreas (PD = 2.2 mm), pancreatic body (PD =       1.3 mm) and pancreatic tail (PD = 0.8). These consisted of lobularity       without honeycombing and hyperechoic strands.      There was no sign of significant endosonographic abnormality in the       common bile duct (2.9 -> 5.4 mm) with only slight prominence of the       common hepatic duct (6.9 mm). Ducts with regular contour were       identified. No evidence of retained choledocholithiasis.      Endosonographic imaging of the ampulla showed no intramural       (subepithelial) lesion.      Endosonographic imaging in the visualized portion of the liver showed no       mass.  No malignant-appearing lymph nodes were visualized in the celiac region       (level 20), peripancreatic region and porta hepatis region.      The celiac region was visualized. Impression:               EGD impression:                           - No gross lesions in the proximal esophagus and in                            the mid esophagus.                           - Submucosal nodule found in the distal esophagus                            moving towards the GE junction.                            - Z-line irregular, 37 cm from the incisors.                           - No gross lesions in the entire stomach.                           - No gross lesions in the duodenal bulb, in the                            first portion of the duodenum and in the second                            portion of the duodenum.                           - Normal flat major papilla hidden under a hood.                           EUS impression:                           - An intramural (subepithelial) lesion was found in                            the lower third of the esophagus. It appeared to                            originate from within the deep mucosa (Layer 2).                            Tissue has not been obtained. However, the                            endosonographic appearance is consistent with a  benign (true) cyst versus esophageal bleb. This is                            distinguished from a esophageal duplication cyst at                            this time.                           - Pancreatic parenchymal abnormalities consisting                            of lobularity and hyperechoic strands were noted in                            the pancreatic head, genu of the pancreas,                            pancreatic body and pancreatic tail.                           - There was no sign of significant pathology in the                            common bile duct and slight prominence of the                            common hepatic duct. No evidence of                            choledocholithiasis or biliary sludge.                           - No malignant-appearing lymph nodes were                            visualized in the celiac region (level 20),                            peripancreatic region and porta hepatis region. Moderate Sedation:      Not Applicable - Patient had care per Anesthesia. Recommendation:           - The patient  will be observed post-procedure,                            until all discharge criteria are met.                           - Discharge patient to home.                           - Resume previous diet.                           - Observe patient's clinical course.                           -  It is not clear that the findings of the                            esophageal bleb/intramural cyst are causing                            patient's symptoms of persistent/chronic epigastric                            abdominal pain. If anything, 1 would query whether                            symptoms of dysphagia would be more likely from                            this, if it was symptomatic. Thus it is not clear                            to me that fluid removal/sampling would make a                            difference in such a small area currently.                           - Pancreatic parenchymal changes not consistent                            with chronic pancreatitis at this time but likely                            regional variation.                           - Further workup as per patient's primary                            gastroenterologist. Query CCK-HIDA?                           - The findings and recommendations were discussed                            with the patient.                           - The findings and recommendations were discussed                            with the patient's family. Procedure Code(s):        --- Professional ---                           [phone], Esophagogastroduodenoscopy, flexible,  transoral; with endoscopic ultrasound examination,                            including the esophagus, stomach, and either the                            duodenum or a surgically altered stomach where the                            jejunum is examined distal to the anastomosis Diagnosis Code(s):        --- Professional ---                            K22.89, Other specified disease of esophagus                           K86.9, Disease of pancreas, unspecified                           I89.9, Noninfective disorder of lymphatic vessels                            and lymph nodes, unspecified                           K22.81, Esophageal polyp                           R10.13, Epigastric pain CPT copyright 2022 [employer]. All rights reserved. The codes documented in this report are preliminary and upon coder review may  be revised to meet current compliance requirements. Yong Henle, MD 10/05/2023 9:57:07 AM Number of Addenda: 0

## 2023-10-05 NOTE — H&P (Signed)
 GASTROENTEROLOGY PROCEDURE H&P NOTE   Primary Care Physician: Virgle Grime, MD  HPI: Brittany Colon is a 63 y.o. female who presents for EGD/EUS to evaluate esophageal nodule/cyst and recurrent abdominal pain.  Past Medical History:  Diagnosis Date   Allergy    Breast cancer (HCC) 12/14/12   right   Breast cyst 12/18/12   mult bilaterally per MRI   Headache(784.0)    History of radiation therapy 02/09/2013-03/31/2013   62.4 gray to the right breast   Personal history of radiation therapy 2014   Right Breast Cancer   Past Surgical History:  Procedure Laterality Date   BIOPSY  06/26/2023   Procedure: BIOPSY;  Surgeon: Nannette Babe, MD;  Location: [facility] ENDOSCOPY;  Service: Gastroenterology;;   BREAST BIOPSY Right 2014   BREAST LUMPECTOMY Right 2014   BREAST LUMPECTOMY WITH NEEDLE LOCALIZATION Right 01/10/2013   Procedure: BREAST LUMPECTOMY WITH NEEDLE LOCALIZATION;  Surgeon: Quitman Bucy, MD;  Location: [facility];  Service: General;  Laterality: Right;   CARPAL TUNNEL RELEASE Right    ESOPHAGOGASTRODUODENOSCOPY (EGD) WITH PROPOFOL  N/A 06/26/2023   Procedure: ESOPHAGOGASTRODUODENOSCOPY (EGD) WITH PROPOFOL ;  Surgeon: Nannette Babe, MD;  Location: [facility] ENDOSCOPY;  Service: Gastroenterology;  Laterality: N/A;   FOOT SURGERY  1993   bone spur lt foot   KNEE SURGERY Bilateral    Current Facility-Administered Medications  Medication Dose Route Frequency Provider Last Rate Last Admin   0.9 %  sodium chloride  infusion   Intravenous Continuous Mansouraty, Albino Alu., MD        Current Facility-Administered Medications:    0.9 %  sodium chloride  infusion, , Intravenous, Continuous, Mansouraty, Albino Alu., MD Allergies  Allergen Reactions   Augmentin [Amoxicillin-Pot Clavulanate] Nausea And Vomiting   Elemental Sulfur     vomiting   Sulfa Antibiotics    Family History  Problem Relation Age of Onset   Heart disease Mother    Social History    Socioeconomic History   Marital status: Married    Spouse name: Not on file   Number of children: 2   Years of education: Not on file   Highest education level: Not on file  Occupational History    Employer: [employer]  Tobacco Use   Smoking status: Never   Smokeless tobacco: Not on file  Substance and Sexual Activity   Alcohol use: No   Drug use: No   Sexual activity: Yes    Comment: menarche 62, P2, 1st birth age 46, no BCP  Other Topics Concern   Not on file  Social History Narrative   Not on file   Social Drivers of Health   Financial Resource Strain: Not on file  Food Insecurity: No Food Insecurity (06/23/2023)   Hunger Vital Sign    Worried About Running Out of Food in the Last Year: Never true    Ran Out of Food in the Last Year: Never true  Transportation Needs: No Transportation Needs (06/23/2023)   PRAPARE - Administrator, Civil Service (Medical): No    Lack of Transportation (Non-Medical): No  Physical Activity: Not on file  Stress: Not on file  Social Connections: Unknown (01/06/2022)   Received from [facility], [facility]   Social Network    Social Network: Not on file  Intimate Partner Violence: Not At Risk (06/23/2023)   Humiliation, Afraid, Rape, and Kick questionnaire    Fear of Current or Ex-Partner: No    Emotionally Abused: No  Physically Abused: No    Sexually Abused: No    Physical Exam: There were no vitals filed for this visit. There is no height or weight on file to calculate BMI. GEN: NAD EYE: Sclerae anicteric ENT: MMM CV: Non-tachycardic GI: Soft, NT/ND NEURO:  Alert & Oriented x 3  Lab Results: No results for input(s): "WBC", "HGB", "HCT", "PLT" in the last 72 hours. BMET No results for input(s): "NA", "K", "CL", "CO2", "GLUCOSE", "BUN", "CREATININE", "CALCIUM " in the last 72 hours. LFT No results for input(s): "PROT", "ALBUMIN", "AST", "ALT", "ALKPHOS", "BILITOT", "BILIDIR", "IBILI" in the  last 72 hours. PT/INR No results for input(s): "LABPROT", "INR" in the last 72 hours.   Impression / Plan: This is a 63 y.o.female who presents for EGD/EUS to evaluate esophageal nodule/cyst and recurrent abdominal pain.  The risks of an EUS including intestinal perforation, bleeding, infection, aspiration, and medication effects were discussed as was the possibility it may not give a definitive diagnosis if a biopsy is performed.  When a biopsy of the pancreas is done as part of the EUS, there is an additional risk of pancreatitis at the rate of about 1-2%.  It was explained that procedure related pancreatitis is typically mild, although it can be severe and even life threatening, which is why we do not perform random pancreatic biopsies and only biopsy a lesion/area we feel is concerning enough to warrant the risk.  The risks and benefits of endoscopic evaluation/treatment were discussed with the patient and/or family; these include but are not limited to the risk of perforation, infection, bleeding, missed lesions, lack of diagnosis, severe illness requiring hospitalization, as well as anesthesia and sedation related illnesses.  The patient's history has been reviewed, patient examined, no change in status, and deemed stable for procedure.  The patient and/or family is agreeable to proceed.    Yong Henle, MD [facility] Office # [phone]

## 2023-10-06 ENCOUNTER — Other Ambulatory Visit: Payer: Self-pay

## 2023-10-06 ENCOUNTER — Telehealth: Payer: Self-pay

## 2023-10-06 DIAGNOSIS — R1013 Epigastric pain: Secondary | ICD-10-CM

## 2023-10-06 NOTE — Telephone Encounter (Signed)
Hida scan scheduled for 10/13/23 at 10:00 am, arriving at 9:30 am at [facility] at Entrance A.   Informed patient of appt and she can't have anything to eat or drink 6 hours prior to the test. Patient verbalized understanding.

## 2023-10-06 NOTE — Telephone Encounter (Signed)
Patient returning call. Please advise

## 2023-10-06 NOTE — Telephone Encounter (Signed)
Left message for patient to return my call.

## 2023-10-06 NOTE — Telephone Encounter (Signed)
-----   Message from Carie Caddy Pyrtle sent at 10/05/2023  2:10 PM EST ----- Please let pt know the eus was reassuring  Given ongoing epigastric pain would proceed with CCK HIDA scan to eval for gallbladder dysfunction JMP ----- Message ----- From: Lemar Lofty., MD Sent: 10/05/2023  10:04 AM EST To: Beverley Fiedler, MD; Leta Baptist, PA-C  Team, Please see upper EUS findings. That area is just a true benign-appearing cyst.  Does not have any evidence of active being a duplication cyst that is particular size.  If it would be causing any symptoms, it would be dysphagia, but again a small as it is I would just normally call this an esophageal bleb.  In any case pancreas looks okay no evidence of issues from that standpoint.  Query CCK HIDA?  All the best. GM

## 2023-10-08 NOTE — Anesthesia Postprocedure Evaluation (Signed)
Anesthesia Post Note  Patient: Brittany Colon  Procedure(s) Performed: UPPER ENDOSCOPIC ULTRASOUND (EUS) RADIAL ESOPHAGOGASTRODUODENOSCOPY (EGD) WITH PROPOFOL     Patient location during evaluation: Endoscopy Anesthesia Type: MAC Level of consciousness: awake and alert Pain management: pain level controlled Vital Signs Assessment: post-procedure vital signs reviewed and stable Respiratory status: spontaneous breathing, nonlabored ventilation, respiratory function stable and patient connected to nasal cannula oxygen Cardiovascular status: stable and blood pressure returned to baseline Postop Assessment: no apparent nausea or vomiting Anesthetic complications: no   No notable events documented.  Last Vitals:  Vitals:   10/05/23 1010 10/05/23 1020  BP: (!) 140/80 134/80  Pulse: 87 85  Resp: 13 13  Temp:    SpO2: 98% 97%    Last Pain:  Vitals:   10/05/23 1020  TempSrc:   PainSc: 3                  Kamyra Schroeck S

## 2023-10-13 ENCOUNTER — Encounter (HOSPITAL_COMMUNITY)
Admission: RE | Admit: 2023-10-13 | Discharge: 2023-10-13 | Disposition: A | Discharge status: Home / Self Care | Payer: Medicare PPO | Source: Ambulatory Visit | Attending: Internal Medicine | Admitting: Internal Medicine

## 2023-10-13 DIAGNOSIS — R1013 Epigastric pain: Secondary | ICD-10-CM | POA: Insufficient documentation

## 2023-10-13 MED ORDER — TECHNETIUM TC 99M MEBROFENIN IV KIT
5.0000 | PACK | Freq: Once | INTRAVENOUS | Status: AC | PRN
  Administered 2023-10-13: 5 via INTRAVENOUS

## 2023-10-19 ENCOUNTER — Other Ambulatory Visit: Payer: Self-pay | Admitting: Internal Medicine

## 2023-10-19 DIAGNOSIS — Z0000 Encounter for general adult medical examination without abnormal findings: Secondary | ICD-10-CM

## 2023-11-03 DIAGNOSIS — I10 Essential (primary) hypertension: Secondary | ICD-10-CM | POA: Diagnosis not present

## 2023-11-03 DIAGNOSIS — N10 Acute tubulo-interstitial nephritis: Secondary | ICD-10-CM | POA: Diagnosis not present

## 2023-11-03 DIAGNOSIS — D649 Anemia, unspecified: Secondary | ICD-10-CM | POA: Diagnosis not present

## 2023-11-03 DIAGNOSIS — N179 Acute kidney failure, unspecified: Secondary | ICD-10-CM | POA: Diagnosis not present

## 2023-11-12 DIAGNOSIS — E1122 Type 2 diabetes mellitus with diabetic chronic kidney disease: Secondary | ICD-10-CM | POA: Diagnosis not present

## 2023-11-12 DIAGNOSIS — N179 Acute kidney failure, unspecified: Secondary | ICD-10-CM | POA: Diagnosis not present

## 2023-11-26 ENCOUNTER — Ambulatory Visit
Admission: RE | Admit: 2023-11-26 | Discharge: 2023-11-26 | Disposition: A | Discharge status: Home / Self Care | Payer: Medicare PPO | Source: Ambulatory Visit | Attending: Internal Medicine | Admitting: Internal Medicine

## 2023-11-26 DIAGNOSIS — N10 Acute tubulo-interstitial nephritis: Secondary | ICD-10-CM | POA: Diagnosis not present

## 2023-11-26 DIAGNOSIS — Z0000 Encounter for general adult medical examination without abnormal findings: Secondary | ICD-10-CM

## 2023-11-26 DIAGNOSIS — I10 Essential (primary) hypertension: Secondary | ICD-10-CM | POA: Diagnosis not present

## 2023-11-26 DIAGNOSIS — N179 Acute kidney failure, unspecified: Secondary | ICD-10-CM | POA: Diagnosis not present

## 2023-11-26 DIAGNOSIS — Z1231 Encounter for screening mammogram for malignant neoplasm of breast: Secondary | ICD-10-CM | POA: Diagnosis not present

## 2023-11-26 DIAGNOSIS — D649 Anemia, unspecified: Secondary | ICD-10-CM | POA: Diagnosis not present

## 2023-12-30 DIAGNOSIS — I10 Essential (primary) hypertension: Secondary | ICD-10-CM | POA: Diagnosis not present

## 2023-12-30 DIAGNOSIS — D649 Anemia, unspecified: Secondary | ICD-10-CM | POA: Diagnosis not present

## 2023-12-30 DIAGNOSIS — N10 Acute tubulo-interstitial nephritis: Secondary | ICD-10-CM | POA: Diagnosis not present

## 2024-02-23 DIAGNOSIS — Z01419 Encounter for gynecological examination (general) (routine) without abnormal findings: Secondary | ICD-10-CM | POA: Diagnosis not present

## 2024-02-24 DIAGNOSIS — E782 Mixed hyperlipidemia: Secondary | ICD-10-CM | POA: Diagnosis not present

## 2024-02-24 DIAGNOSIS — R7309 Other abnormal glucose: Secondary | ICD-10-CM | POA: Diagnosis not present

## 2024-02-24 DIAGNOSIS — Z0000 Encounter for general adult medical examination without abnormal findings: Secondary | ICD-10-CM | POA: Diagnosis not present

## 2024-02-24 DIAGNOSIS — N184 Chronic kidney disease, stage 4 (severe): Secondary | ICD-10-CM | POA: Diagnosis not present

## 2024-02-24 DIAGNOSIS — N179 Acute kidney failure, unspecified: Secondary | ICD-10-CM | POA: Diagnosis not present

## 2024-02-24 DIAGNOSIS — R030 Elevated blood-pressure reading, without diagnosis of hypertension: Secondary | ICD-10-CM | POA: Diagnosis not present

## 2024-03-02 ENCOUNTER — Other Ambulatory Visit (HOSPITAL_BASED_OUTPATIENT_CLINIC_OR_DEPARTMENT_OTHER): Payer: Self-pay | Admitting: Internal Medicine

## 2024-03-02 DIAGNOSIS — R072 Precordial pain: Secondary | ICD-10-CM | POA: Diagnosis not present

## 2024-03-02 DIAGNOSIS — I10 Essential (primary) hypertension: Secondary | ICD-10-CM | POA: Diagnosis not present

## 2024-03-02 DIAGNOSIS — N179 Acute kidney failure, unspecified: Secondary | ICD-10-CM | POA: Diagnosis not present

## 2024-03-02 DIAGNOSIS — N184 Chronic kidney disease, stage 4 (severe): Secondary | ICD-10-CM | POA: Diagnosis not present

## 2024-03-02 DIAGNOSIS — E1122 Type 2 diabetes mellitus with diabetic chronic kidney disease: Secondary | ICD-10-CM | POA: Diagnosis not present

## 2024-03-02 DIAGNOSIS — E782 Mixed hyperlipidemia: Secondary | ICD-10-CM | POA: Diagnosis not present

## 2024-03-02 DIAGNOSIS — Z0000 Encounter for general adult medical examination without abnormal findings: Secondary | ICD-10-CM | POA: Diagnosis not present

## 2024-03-02 DIAGNOSIS — E785 Hyperlipidemia, unspecified: Secondary | ICD-10-CM

## 2024-03-02 DIAGNOSIS — E1165 Type 2 diabetes mellitus with hyperglycemia: Secondary | ICD-10-CM | POA: Diagnosis not present

## 2024-03-02 DIAGNOSIS — Z87448 Personal history of other diseases of urinary system: Secondary | ICD-10-CM | POA: Diagnosis not present

## 2024-03-29 ENCOUNTER — Ambulatory Visit (HOSPITAL_COMMUNITY)

## 2024-03-30 DIAGNOSIS — N179 Acute kidney failure, unspecified: Secondary | ICD-10-CM | POA: Diagnosis not present

## 2024-04-06 ENCOUNTER — Ambulatory Visit (HOSPITAL_COMMUNITY)
Admission: RE | Admit: 2024-04-06 | Discharge: 2024-04-06 | Disposition: A | Discharge status: Home / Self Care | Payer: Self-pay | Source: Ambulatory Visit | Attending: Internal Medicine | Admitting: Internal Medicine

## 2024-04-06 DIAGNOSIS — R072 Precordial pain: Secondary | ICD-10-CM | POA: Insufficient documentation

## 2024-04-06 DIAGNOSIS — E785 Hyperlipidemia, unspecified: Secondary | ICD-10-CM | POA: Insufficient documentation

## 2024-04-20 DIAGNOSIS — N179 Acute kidney failure, unspecified: Secondary | ICD-10-CM | POA: Diagnosis not present

## 2024-04-20 DIAGNOSIS — I10 Essential (primary) hypertension: Secondary | ICD-10-CM | POA: Diagnosis not present

## 2024-04-20 DIAGNOSIS — E1122 Type 2 diabetes mellitus with diabetic chronic kidney disease: Secondary | ICD-10-CM | POA: Diagnosis not present

## 2024-06-18 DIAGNOSIS — E119 Type 2 diabetes mellitus without complications: Secondary | ICD-10-CM | POA: Diagnosis not present

## 2024-06-18 DIAGNOSIS — M25532 Pain in left wrist: Secondary | ICD-10-CM | POA: Diagnosis not present

## 2024-06-18 DIAGNOSIS — N289 Disorder of kidney and ureter, unspecified: Secondary | ICD-10-CM | POA: Diagnosis not present

## 2024-06-18 DIAGNOSIS — M25512 Pain in left shoulder: Secondary | ICD-10-CM | POA: Diagnosis not present

## 2024-06-18 DIAGNOSIS — M542 Cervicalgia: Secondary | ICD-10-CM | POA: Diagnosis not present

## 2024-06-18 DIAGNOSIS — M79645 Pain in left finger(s): Secondary | ICD-10-CM | POA: Diagnosis not present

## 2024-07-19 DIAGNOSIS — N179 Acute kidney failure, unspecified: Secondary | ICD-10-CM | POA: Diagnosis not present

## 2024-08-29 ENCOUNTER — Encounter: Payer: Self-pay | Admitting: Internal Medicine

## 2024-09-26 ENCOUNTER — Ambulatory Visit

## 2024-09-26 VITALS — Ht 63.0 in | Wt 185.0 lb

## 2024-09-26 DIAGNOSIS — Z1211 Encounter for screening for malignant neoplasm of colon: Secondary | ICD-10-CM

## 2024-09-26 MED ORDER — NA SULFATE-K SULFATE-MG SULF 17.5-3.13-1.6 GM/177ML PO SOLN: 1.0000 | Freq: Once | ORAL | 0 refills | Status: AC

## 2024-09-26 NOTE — Progress Notes (Signed)

## 2024-09-28 ENCOUNTER — Encounter: Payer: Self-pay | Admitting: Internal Medicine

## 2024-09-30 ENCOUNTER — Encounter

## 2024-10-14 ENCOUNTER — Encounter: Admitting: Internal Medicine
